# Patient Record
Sex: Female | Born: 1990 | Race: Black or African American | Hispanic: No | Marital: Single | Smoking: Never smoker
Health system: Southern US, Community
[De-identification: ages and names within clinical notes are randomized; demographics above are authoritative.]

## PROBLEM LIST (undated history)

## (undated) DIAGNOSIS — E669 Obesity, unspecified: Secondary | ICD-10-CM

## (undated) DIAGNOSIS — I10 Essential (primary) hypertension: Secondary | ICD-10-CM

## (undated) HISTORY — PX: APPENDECTOMY: SHX54

## (undated) HISTORY — DX: Essential (primary) hypertension: I10

## (undated) HISTORY — PX: ABDOMINAL SURGERY: SHX537

## (undated) HISTORY — PX: GYNECOLOGIC CRYOSURGERY: SHX857

## (undated) HISTORY — PX: INDUCED ABORTION: SHX677

---

## 2012-09-11 ENCOUNTER — Encounter (HOSPITAL_BASED_OUTPATIENT_CLINIC_OR_DEPARTMENT_OTHER): Payer: Self-pay

## 2012-09-11 ENCOUNTER — Emergency Department (HOSPITAL_BASED_OUTPATIENT_CLINIC_OR_DEPARTMENT_OTHER)
Admission: EM | Admit: 2012-09-11 | Discharge: 2012-09-11 | Disposition: A | Discharge status: Home / Self Care | Payer: Medicaid - Out of State | Attending: Emergency Medicine | Admitting: Emergency Medicine

## 2012-09-11 DIAGNOSIS — R509 Fever, unspecified: Secondary | ICD-10-CM | POA: Insufficient documentation

## 2012-09-11 DIAGNOSIS — IMO0001 Reserved for inherently not codable concepts without codable children: Secondary | ICD-10-CM | POA: Insufficient documentation

## 2012-09-11 MED ORDER — ACETAMINOPHEN 325 MG PO TABS
650.0000 mg | ORAL_TABLET | Freq: Once | ORAL | Status: AC
Start: 2012-09-11 — End: 2012-09-11
  Administered 2012-09-11: 650 mg via ORAL
  Filled 2012-09-11: qty 2

## 2012-09-11 NOTE — ED Notes (Signed)
Patient reports that she wants to be checked for the flu. Reports general bodyaches and feels tired since Saturday am. No cold symptoms, denies fever.

## 2012-09-11 NOTE — ED Provider Notes (Signed)
History     CSN: [id number]  Arrival date & time 09/11/12  0307   First MD Initiated Contact with Patient 09/11/12 [phone]      Chief Complaint  Patient presents with  . bodyaches     (Consider location/radiation/quality/duration/timing/severity/associated sxs/prior treatment) HPI Pt presents with c/o fever and body aches.  She states symptoms began yesterday morning.  She took ibuprofen for her symptoms initially.  This did provide some temporary relief.  No cough, no sore throat, no vomiting or diarrhea.  She has been drinking liquids today.  She states her primary reason for coming to the ED was to be tested for flu.  There are no other associated systemic symptoms, there are no other alleviating or modifying factors.   History reviewed. No pertinent past medical history.  History reviewed. No pertinent past surgical history.  No family history on file.  History  Substance Use Topics  . Smoking status: Never Smoker   . Smokeless tobacco: Not on file  . Alcohol Use: Yes     Comment: social    OB History    Grav Para Term Preterm Abortions TAB SAB Ect Mult Living                  Review of Systems ROS reviewed and all otherwise negative except for mentioned in HPI  Allergies  Review of patient's allergies indicates no known allergies.  Home Medications  No current outpatient prescriptions on file.  BP 160/99  Pulse 88  Temp 99.5 F (37.5 C) (Oral)  Resp 16  SpO2 100%  LMP 08/09/2012 Vitals reviewed Physical Exam Physical Examination: General appearance - alert, well appearing, and in no distress Mental status - alert, oriented to person, place, and time Eyes - no scleral icterus, no conjunctival injection Mouth - mucous membranes moist, pharynx normal without lesions Chest - clear to auscultation, no wheezes, rales or rhonchi, symmetric air entry Heart - normal rate, regular rhythm, normal S1, S2, no murmurs, rubs, clicks or gallops Abdomen - soft,  nontender, nondistended, no masses or organomegaly Extremities - peripheral pulses normal, no pedal edema, no clubbing or cyanosis Skin - normal coloration and turgor, no rashes  ED Course  Procedures (including critical care time)  Labs Reviewed - No data to display No results found.   1. Febrile illness       MDM  Pt presenting with c/o fever, myalgias.  Likely viral syndrome, possibly flu.  Discussed with patient symptomatic treatment and signs that warrant re-eval.  Discharged with strict return precautions.  Pt agreeable with plan.        Ethelda Chick, MD 09/11/12 [phone]

## 2017-06-02 ENCOUNTER — Encounter: Payer: Medicaid - Out of State | Admitting: Family Medicine

## 2017-06-22 ENCOUNTER — Other Ambulatory Visit (HOSPITAL_COMMUNITY)
Admission: RE | Admit: 2017-06-22 | Discharge: 2017-06-22 | Disposition: A | Discharge status: Home / Self Care | Payer: Medicaid Other | Source: Ambulatory Visit | Attending: Advanced Practice Midwife | Admitting: Advanced Practice Midwife

## 2017-06-22 ENCOUNTER — Encounter: Payer: Self-pay | Admitting: Advanced Practice Midwife

## 2017-06-22 ENCOUNTER — Ambulatory Visit (INDEPENDENT_AMBULATORY_CARE_PROVIDER_SITE_OTHER): Payer: Medicaid Other | Admitting: Advanced Practice Midwife

## 2017-06-22 VITALS — BP 157/105 | HR 79 | Ht 67.0 in | Wt 273.0 lb

## 2017-06-22 DIAGNOSIS — Z01419 Encounter for gynecological examination (general) (routine) without abnormal findings: Secondary | ICD-10-CM | POA: Insufficient documentation

## 2017-06-22 DIAGNOSIS — Z975 Presence of (intrauterine) contraceptive device: Secondary | ICD-10-CM | POA: Insufficient documentation

## 2017-06-22 DIAGNOSIS — I10 Essential (primary) hypertension: Secondary | ICD-10-CM | POA: Diagnosis not present

## 2017-06-22 DIAGNOSIS — Z0000 Encounter for general adult medical examination without abnormal findings: Secondary | ICD-10-CM | POA: Diagnosis not present

## 2017-06-22 DIAGNOSIS — Z9889 Other specified postprocedural states: Secondary | ICD-10-CM

## 2017-06-22 NOTE — Progress Notes (Signed)
Patient states she takes a water pill but can remember the name.  Patient states she has an IUD but doesn't remember which one it is and how long she has had it. After speaking with her discuss it was after her delivery in 2015. Kathrene Alu RNBSN

## 2017-06-22 NOTE — Progress Notes (Signed)
Patient ID: Bailey Thomas, female   DOB: [birth year], 26 y.o.   MRN: [id number] GYNECOLOGY ANNUAL PREVENTATIVE CARE ENCOUNTER NOTE  Subjective:   Kaijah Abts is a 26 y.o. G75P0020 female here for a routine annual gynecologic exam.  Current complaints: Heavy and lengthy periods for many years, has IUD (unsure what type).   Denies abnormal vaginal bleeding, discharge, pelvic pain, problems with intercourse or other gynecologic concerns.    History C/S.  Has IUD.  Does want STD testing though not sexually active for past several months.  Gynecologic History Patient's last menstrual period was 05/31/2017. Contraception: IUD Last Pap: unsure. Results were: normal   But has had Cryo at age 20 for abnormal pap.  States pap normal ever since.  Obstetric History OB History  Gravida Para Term Preterm AB Living  3       2    SAB TAB Ectopic Multiple Live Births    2     1    # Outcome Date GA Lbr Len/2nd Weight Sex Delivery Anes PTL Lv  3 Gravida 2015     [id number]-[id number]        Complications: Breech delivery, other fetus  2 TAB           1 TAB               Past Medical History:  Diagnosis Date  . Hypertension     Past Surgical History:  Procedure Laterality Date  . APPENDECTOMY    . CESAREAN SECTION     Is on 2 meds for hypertension, unsure what they are  No current outpatient prescriptions on file prior to visit.   No current facility-administered medications on file prior to visit.     No Known Allergies  Social History   Social History  . Marital status: Single    Spouse name: N/A  . Number of children: N/A  . Years of education: N/A   Occupational History  . Not on file.   Social History Main Topics  . Smoking status: Never Smoker  . Smokeless tobacco: Never Used  . Alcohol use Yes     Comment: social  . Drug use: No  . Sexual activity: Not Currently    Birth control/ protection: IUD   Other Topics Concern  . Not on file   Social History Narrative   Moved here  from [place]   Works as Nutritional therapist    Family History  Problem Relation Age of Onset  . Hypertension Mother   . Diabetes Mother   . Cancer Neg Hx   . Stroke Neg Hx     The following portions of the patient's history were reviewed and updated as appropriate: allergies, current medications, past family history, past medical history, past social history, past surgical history and problem list.  Review of Systems Constitutional: negative Respiratory: negative Cardiovascular: negative Gastrointestinal: negative, Normal bowel movements Genitourinary:negative, history of frequent BV Integument/breast: negative Hematologic/lymphatic: negative Musculoskeletal:negative Behavioral/Psych: negative Endocrine: negative   Objective:  BP (!) 157/105 (BP Location: Left Arm)   Pulse 79   Ht 5\' 7"  (1.702 m)   Wt 273 lb (123.8 kg)   LMP 05/31/2017   BMI 42.76 kg/m  CONSTITUTIONAL: Well-developed, well-nourished female in no acute distress.  HENT:  Normocephalic, atraumatic NECK: Normal range of motion, supple, no masses.  Normal thyroid.  SKIN: Skin is warm and dry. No rash noted. Not diaphoretic. No erythema. No pallor. NEUROLOGIC: Alert and oriented to person, place, and time. No  cranial nerve deficit noted. PSYCHIATRIC: Normal mood and affect. Normal behavior. Normal judgment and thought content. CARDIOVASCULAR: Normal heart rate noted, regular rhythm RESPIRATORY: Clear to auscultation bilaterally. Effort and breath sounds normal, no problems with respiration noted. BREASTS: Symmetric in size. No masses, skin changes, nipple drainage, or lymphadenopathy. ABDOMEN: Soft, normal bowel sounds, no distention noted.  No tenderness, rebound or guarding.  PELVIC: Normal appearing external genitalia; normal appearing vaginal mucosa and cervix.  No abnormal discharge noted.  Pap smear obtained.  Normal uterine size, no other palpable masses, no uterine or adnexal tenderness. MUSCULOSKELETAL:  Normal range of motion. No tenderness.  No cyanosis, clubbing, or edema.     Assessment:  Annual gynecologic examination with pap smear History of cryosurgery Hypertension   Plan:  Will follow up results of pap smear and manage accordingly. Discussed need to establish withPrimary Care doctor. Assisted her in finding providers in [facility] who take Medicaid ([facility]) We added TSH, CBC, and Lipid profile since she is fasting.  This will hopefully assist her primary MD when she goes to her physical Discussed uncontrolled hypertension and need for care ASAP Added STD testing to pap per request Advised about signing up for My Chart Routine preventative health maintenance measures emphasized. Please refer to After Visit Summary for other counseling recommendations.

## 2017-06-22 NOTE — Patient Instructions (Signed)
Hypertension Hypertension, commonly called high blood pressure, is when the force of blood pumping through the arteries is too strong. The arteries are the blood vessels that carry blood from the heart throughout the body. Hypertension forces the heart to work harder to pump blood and may cause arteries to become narrow or stiff. Having untreated or uncontrolled hypertension can cause heart attacks, strokes, kidney disease, and other problems. A blood pressure reading consists of a higher number over a lower number. Ideally, your blood pressure should be below 120/80. The first ("top") number is called the systolic pressure. It is a measure of the pressure in your arteries as your heart beats. The second ("bottom") number is called the diastolic pressure. It is a measure of the pressure in your arteries as the heart relaxes. What are the causes? The cause of this condition is not known. What increases the risk? Some risk factors for high blood pressure are under your control. Others are not. Factors you can change  Smoking.  Having type 2 diabetes mellitus, high cholesterol, or both.  Not getting enough exercise or physical activity.  Being overweight.  Having too much fat, sugar, calories, or salt (sodium) in your diet.  Drinking too much alcohol. Factors that are difficult or impossible to change  Having chronic kidney disease.  Having a family history of high blood pressure.  Age. Risk increases with age.  Race. You may be at higher risk if you are African-American.  Gender. Men are at higher risk than women before age 45. After age 65, women are at higher risk than men.  Having obstructive sleep apnea.  Stress. What are the signs or symptoms? Extremely high blood pressure (hypertensive crisis) may cause:  Headache.  Anxiety.  Shortness of breath.  Nosebleed.  Nausea and vomiting.  Severe chest pain.  Jerky movements you cannot control (seizures).  How is this  diagnosed? This condition is diagnosed by measuring your blood pressure while you are seated, with your arm resting on a surface. The cuff of the blood pressure monitor will be placed directly against the skin of your upper arm at the level of your heart. It should be measured at least twice using the same arm. Certain conditions can cause a difference in blood pressure between your right and left arms. Certain factors can cause blood pressure readings to be lower or higher than normal (elevated) for a short period of time:  When your blood pressure is higher when you are in a health care provider's office than when you are at home, this is called white coat hypertension. Most people with this condition do not need medicines.  When your blood pressure is higher at home than when you are in a health care provider's office, this is called masked hypertension. Most people with this condition may need medicines to control blood pressure.  If you have a high blood pressure reading during one visit or you have normal blood pressure with other risk factors:  You may be asked to return on a different day to have your blood pressure checked again.  You may be asked to monitor your blood pressure at home for 1 week or longer.  If you are diagnosed with hypertension, you may have other blood or imaging tests to help your health care provider understand your overall risk for other conditions. How is this treated? This condition is treated by making healthy lifestyle changes, such as eating healthy foods, exercising more, and reducing your alcohol intake. Your   health care provider may prescribe medicine if lifestyle changes are not enough to get your blood pressure under control, and if:  Your systolic blood pressure is above 130.  Your diastolic blood pressure is above 80.  Your personal target blood pressure may vary depending on your medical conditions, your age, and other factors. Follow these  instructions at home: Eating and drinking  Eat a diet that is high in fiber and potassium, and low in sodium, added sugar, and fat. An example eating plan is called the DASH (Dietary Approaches to Stop Hypertension) diet. To eat this way: ? Eat plenty of fresh fruits and vegetables. Try to fill half of your plate at each meal with fruits and vegetables. ? Eat whole grains, such as whole wheat pasta, brown rice, or whole grain bread. Fill about one quarter of your plate with whole grains. ? Eat or drink low-fat dairy products, such as skim milk or low-fat yogurt. ? Avoid fatty cuts of meat, processed or cured meats, and poultry with skin. Fill about one quarter of your plate with lean proteins, such as fish, chicken without skin, beans, eggs, and tofu. ? Avoid premade and processed foods. These tend to be higher in sodium, added sugar, and fat.  Reduce your daily sodium intake. Most people with hypertension should eat less than 1,500 mg of sodium a day.  Limit alcohol intake to no more than 1 drink a day for nonpregnant women and 2 drinks a day for men. One drink equals 12 oz of beer, 5 oz of wine, or 1 oz of hard liquor. Lifestyle  Work with your health care provider to maintain a healthy body weight or to lose weight. Ask what an ideal weight is for you.  Get at least 30 minutes of exercise that causes your heart to beat faster (aerobic exercise) most days of the week. Activities may include walking, swimming, or biking.  Include exercise to strengthen your muscles (resistance exercise), such as pilates or lifting weights, as part of your weekly exercise routine. Try to do these types of exercises for 30 minutes at least 3 days a week.  Do not use any products that contain nicotine or tobacco, such as cigarettes and e-cigarettes. If you need help quitting, ask your health care provider.  Monitor your blood pressure at home as told by your health care provider.  Keep all follow-up visits as  told by your health care provider. This is important. Medicines  Take over-the-counter and prescription medicines only as told by your health care provider. Follow directions carefully. Blood pressure medicines must be taken as prescribed.  Do not skip doses of blood pressure medicine. Doing this puts you at risk for problems and can make the medicine less effective.  Ask your health care provider about side effects or reactions to medicines that you should watch for. Contact a health care provider if:  You think you are having a reaction to a medicine you are taking.  You have headaches that keep coming back (recurring).  You feel dizzy.  You have swelling in your ankles.  You have trouble with your vision. Get help right away if:  You develop a severe headache or confusion.  You have unusual weakness or numbness.  You feel faint.  You have severe pain in your chest or abdomen.  You vomit repeatedly.  You have trouble breathing. Summary  Hypertension is when the force of blood pumping through your arteries is too strong. If this condition is not   in your ankles.   You have trouble with your vision.  Get help right away if:   You develop a severe headache or confusion.   You have unusual weakness or numbness.   You feel faint.   You have severe pain in your chest or abdomen.   You vomit repeatedly.   You have trouble breathing.  Summary   Hypertension is when the force of blood pumping through your arteries is too strong. If this condition is not controlled, it may put you at risk for serious complications.   Your personal target blood pressure may vary depending on your medical conditions, your age, and other factors. For most people, a normal blood pressure is less than 120/80.   Hypertension is treated with lifestyle changes, medicines, or a combination of both. Lifestyle changes include weight loss, eating a healthy, low-sodium diet, exercising more, and limiting alcohol.  This information is not intended to replace advice given to you by your health care provider. Make sure you discuss any questions you have with your health care provider.  Document Released: 08/10/2005 Document Revised: 07/08/2016 Document Reviewed: 07/08/2016  [employer] Interactive Patient Education  2018 [employer]  Health Maintenance, Female  Adopting a healthy lifestyle and getting preventive care can  go a long way to promote health and wellness. Talk with your health care provider about what schedule of regular examinations is right for you. This is a good chance for you to check in with your provider about disease prevention and staying healthy.  In between checkups, there are plenty of things you can do on your own. Experts have done a lot of research about which lifestyle changes and preventive measures are most likely to keep you healthy. Ask your health care provider for more information.  Weight and diet  Eat a healthy diet   Be sure to include plenty of vegetables, fruits, low-fat dairy products, and lean protein.   Do not eat a lot of foods high in solid fats, added sugars, or salt.   Get regular exercise. This is one of the most important things you can do for your health.  ? Most adults should exercise for at least 150 minutes each week. The exercise should increase your heart rate and make you sweat (moderate-intensity exercise).  ? Most adults should also do strengthening exercises at least twice a week. This is in addition to the moderate-intensity exercise.    Maintain a healthy weight   Body mass index (BMI) is a measurement that can be used to identify possible weight problems. It estimates body fat based on height and weight. Your health care provider can help determine your BMI and help you achieve or maintain a healthy weight.   For females 20 years of age and older:  ? A BMI below 18.5 is considered underweight.  ? A BMI of 18.5 to 24.9 is normal.  ? A BMI of 25 to 29.9 is considered overweight.  ? A BMI of 30 and above is considered obese.    Watch levels of cholesterol and blood lipids   You should start having your blood tested for lipids and cholesterol at 27 years of age, then have this test every 5 years.   You may need to have your cholesterol levels checked more often if:  ? Your lipid or cholesterol levels are high.  ? You are older than 27 years of age.  ? You are at high  risk for heart disease.    Cancer   screening  Lung Cancer   Lung cancer screening is recommended for adults 55-80 years old who are at high risk for lung cancer because of a history of smoking.   A yearly low-dose CT scan of the lungs is recommended for people who:  ? Currently smoke.  ? Have quit within the past 15 years.  ? Have at least a 30-pack-year history of smoking. A pack year is smoking an average of one pack of cigarettes a day for 1 year.   Yearly screening should continue until it has been 15 years since you quit.   Yearly screening should stop if you develop a health problem that would prevent you from having lung cancer treatment.    Breast Cancer   Practice breast self-awareness. This means understanding how your breasts normally appear and feel.   It also means doing regular breast self-exams. Let your health care provider know about any changes, no matter how small.   If you are in your 20s or 30s, you should have a clinical breast exam (CBE) by a health care provider every 1-3 years as part of a regular health exam.   If you are 40 or older, have a CBE every year. Also consider having a breast X-ray (mammogram) every year.   If you have a family history of breast cancer, talk to your health care provider about genetic screening.   If you are at high risk for breast cancer, talk to your health care provider about having an MRI and a mammogram every year.   Breast cancer gene (BRCA) assessment is recommended for women who have family members with BRCA-related cancers. BRCA-related cancers include:  ? Breast.  ? Ovarian.  ? Tubal.  ? Peritoneal cancers.   Results of the assessment will determine the need for genetic counseling and BRCA1 and BRCA2 testing.    Cervical Cancer  Your health care provider may recommend that you be screened regularly for cancer of the pelvic organs (ovaries, uterus, and vagina). This screening involves a pelvic examination, including checking for microscopic  changes to the surface of your cervix (Pap test). You may be encouraged to have this screening done every 3 years, beginning at age 21.   For women ages 30-65, health care providers may recommend pelvic exams and Pap testing every 3 years, or they may recommend the Pap and pelvic exam, combined with testing for human papilloma virus (HPV), every 5 years. Some types of HPV increase your risk of cervical cancer. Testing for HPV may also be done on women of any age with unclear Pap test results.   Other health care providers may not recommend any screening for nonpregnant women who are considered low risk for pelvic cancer and who do not have symptoms. Ask your health care provider if a screening pelvic exam is right for you.   If you have had past treatment for cervical cancer or a condition that could lead to cancer, you need Pap tests and screening for cancer for at least 20 years after your treatment. If Pap tests have been discontinued, your risk factors (such as having a new sexual partner) need to be reassessed to determine if screening should resume. Some women have medical problems that increase the chance of getting cervical cancer. In these cases, your health care provider may recommend more frequent screening and Pap tests.    Colorectal Cancer   This type of cancer can be detected and often prevented.   Routine colorectal cancer screening usually   begins at 27 years of age and continues through 27 years of age.   Your health care provider may recommend screening at an earlier age if you have risk factors for colon cancer.   Your health care provider may also recommend using home test kits to check for hidden blood in the stool.   A small camera at the end of a tube can be used to examine your colon directly (sigmoidoscopy or colonoscopy). This is done to check for the earliest forms of colorectal cancer.   Routine screening usually begins at age 50.   Direct examination of the colon should be  repeated every 5-10 years through 27 years of age. However, you may need to be screened more often if early forms of precancerous polyps or small growths are found.    Skin Cancer   Check your skin from head to toe regularly.   Tell your health care provider about any new moles or changes in moles, especially if there is a change in a mole's shape or color.   Also tell your health care provider if you have a mole that is larger than the size of a pencil eraser.   Always use sunscreen. Apply sunscreen liberally and repeatedly throughout the day.   Protect yourself by wearing long sleeves, pants, a wide-brimmed hat, and sunglasses whenever you are outside.    Heart disease, diabetes, and high blood pressure   High blood pressure causes heart disease and increases the risk of stroke. High blood pressure is more likely to develop in:  ? People who have blood pressure in the high end of the normal range (130-139/85-89 mm Hg).  ? People who are overweight or obese.  ? People who are African American.   If you are 18-39 years of age, have your blood pressure checked every 3-5 years. If you are 40 years of age or older, have your blood pressure checked every year. You should have your blood pressure measured twice--once when you are at a hospital or clinic, and once when you are not at a hospital or clinic. Record the average of the two measurements. To check your blood pressure when you are not at a hospital or clinic, you can use:  ? An automated blood pressure machine at a pharmacy.  ? A home blood pressure monitor.   If you are between 55 years and 79 years old, ask your health care provider if you should take aspirin to prevent strokes.   Have regular diabetes screenings. This involves taking a blood sample to check your fasting blood sugar level.  ? If you are at a normal weight and have a low risk for diabetes, have this test once every three years after 27 years of age.  ? If you are overweight and have a  high risk for diabetes, consider being tested at a younger age or more often.  Preventing infection  Hepatitis B   If you have a higher risk for hepatitis B, you should be screened for this virus. You are considered at high risk for hepatitis B if:  ? You were born in a country where hepatitis B is common. Ask your health care provider which countries are considered high risk.  ? Your parents were born in a high-risk country, and you have not been immunized against hepatitis B (hepatitis B vaccine).  ? You have HIV or AIDS.  ? You use needles to inject street drugs.  ? You live with someone who has   hepatitis B.  ? You have had sex with someone who has hepatitis B.  ? You get hemodialysis treatment.  ? You take certain medicines for conditions, including cancer, organ transplantation, and autoimmune conditions.    Hepatitis C   Blood testing is recommended for:  ? Everyone born from 1945 through 1965.  ? Anyone with known risk factors for hepatitis C.    Sexually transmitted infections (STIs)   You should be screened for sexually transmitted infections (STIs) including gonorrhea and chlamydia if:  ? You are sexually active and are younger than 27 years of age.  ? You are older than 27 years of age and your health care provider tells you that you are at risk for this type of infection.  ? Your sexual activity has changed since you were last screened and you are at an increased risk for chlamydia or gonorrhea. Ask your health care provider if you are at risk.   If you do not have HIV, but are at risk, it may be recommended that you take a prescription medicine daily to prevent HIV infection. This is called pre-exposure prophylaxis (PrEP). You are considered at risk if:  ? You are sexually active and do not regularly use condoms or know the HIV status of your partner(s).  ? You take drugs by injection.  ? You are sexually active with a partner who has HIV.    Talk with your health care provider about whether you are  at high risk of being infected with HIV. If you choose to begin PrEP, you should first be tested for HIV. You should then be tested every 3 months for as long as you are taking PrEP.  Pregnancy   If you are premenopausal and you may become pregnant, ask your health care provider about preconception counseling.   If you may become pregnant, take 400 to 800 micrograms (mcg) of folic acid every day.   If you want to prevent pregnancy, talk to your health care provider about birth control (contraception).  Osteoporosis and menopause   Osteoporosis is a disease in which the bones lose minerals and strength with aging. This can result in serious bone fractures. Your risk for osteoporosis can be identified using a bone density scan.   If you are 65 years of age or older, or if you are at risk for osteoporosis and fractures, ask your health care provider if you should be screened.   Ask your health care provider whether you should take a calcium or vitamin D supplement to lower your risk for osteoporosis.   Menopause may have certain physical symptoms and risks.   Hormone replacement therapy may reduce some of these symptoms and risks.  Talk to your health care provider about whether hormone replacement therapy is right for you.  Follow these instructions at home:   Schedule regular health, dental, and eye exams.   Stay current with your immunizations.   Do not use any tobacco products including cigarettes, chewing tobacco, or electronic cigarettes.   If you are pregnant, do not drink alcohol.   If you are breastfeeding, limit how much and how often you drink alcohol.   Limit alcohol intake to no more than 1 drink per day for nonpregnant women. One drink equals 12 ounces of beer, 5 ounces of wine, or 1 ounces of hard liquor.   Do not use street drugs.   Do not share needles.   Ask your health care provider for help if you need

## 2017-06-23 LAB — LIPID PANEL
Chol/HDL Ratio: 4.4 ratio (ref 0.0–4.4)
Cholesterol, Total: 226 mg/dL — ABNORMAL HIGH (ref 100–199)
HDL: 51 mg/dL (ref >39)
LDL Calculated: 159 mg/dL — ABNORMAL HIGH (ref 0–99)
TRIGLYCERIDES: 81 mg/dL (ref 0–149)
VLDL CHOLESTEROL CAL: 16 mg/dL (ref 5–40)

## 2017-06-23 LAB — CBC
Hematocrit: 34.2 % (ref 34.0–46.6)
Hemoglobin: 11 g/dL — ABNORMAL LOW (ref 11.1–15.9)
MCH: 25.8 pg — ABNORMAL LOW (ref 26.6–33.0)
MCHC: 32.2 g/dL (ref 31.5–35.7)
MCV: 80 fL (ref 79–97)
PLATELETS: 192 10*3/uL (ref 150–379)
RBC: 4.26 x10E6/uL (ref 3.77–5.28)
RDW: 16.2 % — ABNORMAL HIGH (ref 12.3–15.4)
WBC: 3.5 10*3/uL (ref 3.4–10.8)

## 2017-06-23 LAB — TSH: TSH: 1.3 u[IU]/mL (ref 0.450–4.500)

## 2017-06-23 LAB — HIV ANTIBODY (ROUTINE TESTING W REFLEX): HIV Screen 4th Generation wRfx: NONREACTIVE

## 2017-06-23 LAB — RPR: RPR Ser Ql: NONREACTIVE

## 2017-06-24 ENCOUNTER — Other Ambulatory Visit: Payer: Self-pay | Admitting: Advanced Practice Midwife

## 2017-06-24 LAB — CYTOLOGY - PAP
BACTERIAL VAGINITIS: POSITIVE — AB
Candida vaginitis: NEGATIVE
Chlamydia: NEGATIVE
DIAGNOSIS: NEGATIVE
Neisseria Gonorrhea: NEGATIVE

## 2017-06-24 NOTE — Progress Notes (Signed)
+   [place] No pharmacy listed WIll have RN call pt to get pharm Seabron Spates, CNM

## 2017-09-04 ENCOUNTER — Other Ambulatory Visit: Payer: Self-pay

## 2017-09-04 ENCOUNTER — Encounter (HOSPITAL_BASED_OUTPATIENT_CLINIC_OR_DEPARTMENT_OTHER): Payer: Self-pay | Admitting: Emergency Medicine

## 2017-09-04 ENCOUNTER — Emergency Department (HOSPITAL_BASED_OUTPATIENT_CLINIC_OR_DEPARTMENT_OTHER)
Admission: EM | Admit: 2017-09-04 | Discharge: 2017-09-04 | Disposition: A | Discharge status: Home / Self Care | Payer: Medicaid Other | Attending: Emergency Medicine | Admitting: Emergency Medicine

## 2017-09-04 DIAGNOSIS — Z30011 Encounter for initial prescription of contraceptive pills: Secondary | ICD-10-CM | POA: Diagnosis not present

## 2017-09-04 DIAGNOSIS — N898 Other specified noninflammatory disorders of vagina: Secondary | ICD-10-CM | POA: Insufficient documentation

## 2017-09-04 DIAGNOSIS — A599 Trichomoniasis, unspecified: Secondary | ICD-10-CM | POA: Diagnosis not present

## 2017-09-04 DIAGNOSIS — I10 Essential (primary) hypertension: Secondary | ICD-10-CM | POA: Diagnosis not present

## 2017-09-04 DIAGNOSIS — N390 Urinary tract infection, site not specified: Secondary | ICD-10-CM | POA: Diagnosis not present

## 2017-09-04 DIAGNOSIS — R300 Dysuria: Secondary | ICD-10-CM | POA: Diagnosis present

## 2017-09-04 DIAGNOSIS — Z30432 Encounter for removal of intrauterine contraceptive device: Secondary | ICD-10-CM

## 2017-09-04 LAB — PREGNANCY, URINE: Preg Test, Ur: NEGATIVE

## 2017-09-04 LAB — URINALYSIS, MICROSCOPIC (REFLEX)

## 2017-09-04 LAB — WET PREP, GENITAL
Clue Cells Wet Prep HPF POC: NONE SEEN
Sperm: NONE SEEN
WBC WET PREP: NONE SEEN
YEAST WET PREP: NONE SEEN

## 2017-09-04 LAB — URINALYSIS, ROUTINE W REFLEX MICROSCOPIC
BILIRUBIN URINE: NEGATIVE
Glucose, UA: NEGATIVE mg/dL
HGB URINE DIPSTICK: NEGATIVE
Ketones, ur: NEGATIVE mg/dL
NITRITE: NEGATIVE
PH: 6.5 (ref 5.0–8.0)
Protein, ur: NEGATIVE mg/dL
SPECIFIC GRAVITY, URINE: 1.015 (ref 1.005–1.030)

## 2017-09-04 MED ORDER — CEFTRIAXONE SODIUM 250 MG IJ SOLR
250.0000 mg | Freq: Once | INTRAMUSCULAR | Status: AC
  Administered 2017-09-04: 250 mg via INTRAMUSCULAR
  Filled 2017-09-04: qty 250

## 2017-09-04 MED ORDER — NORGESTIMATE-ETH ESTRADIOL 0.25-35 MG-MCG PO TABS: 1.0000 | ORAL_TABLET | Freq: Every day | ORAL | 11 refills | Status: AC

## 2017-09-04 MED ORDER — DOXYCYCLINE HYCLATE 100 MG PO TABS
100.0000 mg | ORAL_TABLET | Freq: Once | ORAL | Status: AC
  Administered 2017-09-04: 100 mg via ORAL
  Filled 2017-09-04: qty 1

## 2017-09-04 MED ORDER — METRONIDAZOLE 500 MG PO TABS
2000.0000 mg | ORAL_TABLET | Freq: Once | ORAL | Status: AC
  Administered 2017-09-04: 2000 mg via ORAL
  Filled 2017-09-04: qty 4

## 2017-09-04 MED ORDER — DOXYCYCLINE HYCLATE 100 MG PO TBEC: 100.0000 mg | DELAYED_RELEASE_TABLET | Freq: Two times a day (BID) | ORAL | 0 refills | Status: DC

## 2017-09-04 NOTE — Discharge Instructions (Addendum)
You were seen and evaluated in the Emergency Department for urinary symptoms. Please take the full course of your antibiotics.   You were treated for chlamydia and trichomoniasis here in the Emergency Department.  Your IUD was removed and contraceptive pills were prescribed.  Follow up with your regular doctor.

## 2017-09-04 NOTE — ED Provider Notes (Signed)
[facility] EMERGENCY DEPARTMENT Provider Note   CSN: [id number] Arrival date & time: 09/04/17  1334     History   Chief Complaint Chief Complaint  Patient presents with  . Vaginal Itching    HPI Bailey Thomas is a 27 y.o. female. Vaginal itching started Tuesday. Vaginal area now irritated, less pruritic since using vagisil. No discharge. No sores or lesions. Sexually active and uses IUD for birth control. Has not had yeast infection for 10 years. No abnormal vaginal bleeding, no significant pelvic pain or fever. Patient denies known exposure to STD.  Patient does endorse urinary frequency and dysuria. She denies urgency. No fevers, no back pain.  Patient's last menstrual period was 09/02/2017.  Past Medical History:  Diagnosis Date  . Hypertension     Patient Active Problem List   Diagnosis Date Noted  . History of cryosurgery 06/22/2017    Past Surgical History:  Procedure Laterality Date  . APPENDECTOMY    . CESAREAN SECTION      OB History    Gravida Para Term Preterm AB Living   3       2     SAB TAB Ectopic Multiple Live Births     2     1       Home Medications    Prior to Admission medications   Medication Sig Start Date End Date Taking? Authorizing Provider  norgestimate-ethinyl estradiol (ORTHO-CYCLEN,SPRINTEC,PREVIFEM) 0.25-35 MG-MCG tablet Take 1 tablet by mouth daily. 09/04/17   Everrett Coombe, MD    Family History Family History  Problem Relation Age of Onset  . Hypertension Mother   . Diabetes Mother   . Cancer Neg Hx   . Stroke Neg Hx     Social History Social History   Tobacco Use  . Smoking status: Never Smoker  . Smokeless tobacco: Never Used  Substance Use Topics  . Alcohol use: Yes    Comment: social  . Drug use: No     Allergies   Patient has no known allergies.   Review of Systems Review of Systems See HPI for ROS   Physical Exam Updated Vital Signs BP (!) 148/114 (BP Location: Right Arm)   Pulse 94    Temp 98.4 F (36.9 C) (Oral)   Resp 18   Ht 5\' 7"  (1.702 m)   Wt 124.7 kg (275 lb)   LMP 09/02/2017   SpO2 100%   BMI 43.07 kg/m   Physical Exam  Constitutional: She appears well-developed and well-nourished. No distress.  Neck: Neck supple.  Cardiovascular: Normal rate and regular rhythm.  Pulmonary/Chest: Effort normal and breath sounds normal.  Skin: Skin is warm and dry. She is not diaphoretic.  Female genitalia: Vagina: normal appearing vagina with normal color and discharge, no lesions Cervix: cervical discharge present - white IUD string clearly visualized    ED Treatments / Results  Labs (all labs ordered are listed, but only abnormal results are displayed) Labs Reviewed  URINALYSIS, ROUTINE W REFLEX MICROSCOPIC - Abnormal; Notable for the following components:      Result Value   Leukocytes, UA SMALL (*)    All other components within normal limits  URINALYSIS, MICROSCOPIC (REFLEX) - Abnormal; Notable for the following components:   Bacteria, UA MANY (*)    Squamous Epithelial / LPF 0-5 (*)    All other components within normal limits  WET PREP, GENITAL  PREGNANCY, URINE  GC/CHLAMYDIA PROBE AMP ([place]) NOT AT [facility]    EKG  EKG Interpretation None       Radiology No results found.  Procedures Procedures (including critical care time)  IUD Removal Speculum placed. IUD string clearly visualized and pulled with gentle tension until IUD was removed. No bleeding. Patient with mild cramping.  Medications Ordered in ED Medications  cefTRIAXone (ROCEPHIN) injection 250 mg (250 mg Intramuscular Given 09/04/17 1516)  doxycycline (VIBRA-TABS) tablet 100 mg (100 mg Oral Given 09/04/17 1516)     Initial Impression / Assessment and Plan / ED Course  I have reviewed the triage vital signs and the nursing notes.  Pertinent labs & imaging results that were available during my care of the patient were reviewed by me and considered in my medical decision making  (see chart for details).   UTI Urine studies and symptoms consistent with UTI. Patient sent with doxycycline which will also cover for chlamydia.  Trich Wet prep notable for trichomoniasis. Patient was given one time dose at 2g to treat in ED. Patient was also given ceftriaxone injection to cover for chlamydia in the ED.  OCP Patient asked for IUD to be removed. It was removed in the emergency department and OCP was prescribed at patient request. OCP counseling provided.  Patient considered stable for discharge with PCP.   Final Clinical Impressions(s) / ED Diagnoses   Final diagnoses:  None    ED Discharge Orders        Ordered    norgestimate-ethinyl estradiol (ORTHO-CYCLEN,SPRINTEC,PREVIFEM) 0.25-35 MG-MCG tablet  Daily     09/04/17 1529       Everrett Coombe, MD 09/04/17 1539    Drenda Freeze, MD 09/05/17 [phone]

## 2017-09-04 NOTE — ED Triage Notes (Signed)
Patient states that she is having pain with urination and it is "itchy" - she has had similar symptoms when her IUD was falling out last time

## 2017-09-06 LAB — GC/CHLAMYDIA PROBE AMP (~~LOC~~) NOT AT ARMC
CHLAMYDIA, DNA PROBE: NEGATIVE
NEISSERIA GONORRHEA: NEGATIVE

## 2017-10-12 ENCOUNTER — Emergency Department (HOSPITAL_BASED_OUTPATIENT_CLINIC_OR_DEPARTMENT_OTHER)
Admission: EM | Admit: 2017-10-12 | Discharge: 2017-10-12 | Disposition: A | Discharge status: Home / Self Care | Payer: Medicaid Other | Attending: Emergency Medicine | Admitting: Emergency Medicine

## 2017-10-12 ENCOUNTER — Encounter (HOSPITAL_BASED_OUTPATIENT_CLINIC_OR_DEPARTMENT_OTHER): Payer: Self-pay | Admitting: Emergency Medicine

## 2017-10-12 ENCOUNTER — Other Ambulatory Visit: Payer: Self-pay

## 2017-10-12 DIAGNOSIS — N898 Other specified noninflammatory disorders of vagina: Secondary | ICD-10-CM | POA: Diagnosis present

## 2017-10-12 DIAGNOSIS — I10 Essential (primary) hypertension: Secondary | ICD-10-CM | POA: Insufficient documentation

## 2017-10-12 DIAGNOSIS — Z79899 Other long term (current) drug therapy: Secondary | ICD-10-CM | POA: Insufficient documentation

## 2017-10-12 DIAGNOSIS — N760 Acute vaginitis: Secondary | ICD-10-CM

## 2017-10-12 DIAGNOSIS — K130 Diseases of lips: Secondary | ICD-10-CM | POA: Insufficient documentation

## 2017-10-12 HISTORY — DX: Obesity, unspecified: E66.9

## 2017-10-12 LAB — WET PREP, GENITAL
SPERM: NONE SEEN
TRICH WET PREP: NONE SEEN
YEAST WET PREP: NONE SEEN

## 2017-10-12 LAB — PREGNANCY, URINE: Preg Test, Ur: NEGATIVE

## 2017-10-12 MED ORDER — FLUCONAZOLE 100 MG PO TABS
200.0000 mg | ORAL_TABLET | Freq: Once | ORAL | Status: AC
  Administered 2017-10-12: 200 mg via ORAL
  Filled 2017-10-12: qty 2

## 2017-10-12 MED ORDER — METRONIDAZOLE 500 MG PO TABS: 500.0000 mg | ORAL_TABLET | Freq: Two times a day (BID) | ORAL | 0 refills | Status: DC

## 2017-10-12 MED FILL — metroNIDAZOLE 500 MG TABS: 500 | 7 days supply | Qty: 14 | Fill #0

## 2017-10-12 NOTE — Discharge Instructions (Signed)
It was our pleasure to provide your ER care today - we hope that you feel better.  Take flagyl (antibiotic) as prescribed - do not drink alcohol when taking this antibiotic.   Follow up with primary care doctor in 1 week if symptoms fail to improve/resolve.  Return to ER if worse, new symptoms, severe abdominal pain, fevers, other concern.

## 2017-10-12 NOTE — ED Triage Notes (Signed)
Vaginal itching and white discharge x4 days. Flap of skin inside upper lip where she had a piercing, x2 days.

## 2017-10-12 NOTE — ED Provider Notes (Signed)
[facility] EMERGENCY DEPARTMENT Provider Note   CSN: [id number] Arrival date & time: 10/12/17  0740     History   Chief Complaint Chief Complaint  Patient presents with  . Vaginal Itching    HPI Bailey Thomas is a 27 y.o. female.  Patient c/o vaginal itching and mild whitish discharge in the past 2 days. Remote hx yeast infection. No recent antibiotics. Denies abd or pelvic pain. No dysuria or hematuria. No vaginal bleeding. Having normal periods. Also notes small area hypertrophied mucosal tissue around upper lip at site of recent piercing. Pt has removed piercing. No discharge to area. No fever.   The history is provided by the patient.  Vaginal Itching  Pertinent negatives include no abdominal pain.    Past Medical History:  Diagnosis Date  . Hypertension   . Obesity     Patient Active Problem List   Diagnosis Date Noted  . History of cryosurgery 06/22/2017    Past Surgical History:  Procedure Laterality Date  . APPENDECTOMY    . CESAREAN SECTION      OB History    Gravida Para Term Preterm AB Living   3       2     SAB TAB Ectopic Multiple Live Births     2     1       Home Medications    Prior to Admission medications   Medication Sig Start Date End Date Taking? Authorizing Provider  doxycycline (DORYX) 100 MG EC tablet Take 1 tablet (100 mg total) by mouth 2 (two) times daily. 09/04/17   Everrett Coombe, MD  norgestimate-ethinyl estradiol (ORTHO-CYCLEN,SPRINTEC,PREVIFEM) 0.25-35 MG-MCG tablet Take 1 tablet by mouth daily. 09/04/17   Everrett Coombe, MD    Family History Family History  Problem Relation Age of Onset  . Hypertension Mother   . Diabetes Mother   . Cancer Neg Hx   . Stroke Neg Hx     Social History Social History   Tobacco Use  . Smoking status: Never Smoker  . Smokeless tobacco: Never Used  Substance Use Topics  . Alcohol use: Yes    Comment: social  . Drug use: No     Allergies   Patient has no known  allergies.   Review of Systems Review of Systems  Constitutional: Negative for fever.  HENT: Negative for sore throat.   Gastrointestinal: Negative for abdominal pain and vomiting.  Genitourinary: Positive for vaginal discharge. Negative for dysuria and pelvic pain.  Skin: Negative for rash.     Physical Exam Updated Vital Signs BP (!) 173/108 (BP Location: Right Arm)   Pulse 94   Temp 98.8 F (37.1 C) (Oral)   Resp 16   Ht 1.676 m (5\' 6" )   Wt 124.7 kg (275 lb)   LMP 09/23/2017 (Exact Date)   SpO2 100%   BMI 44.39 kg/m   Physical Exam  Constitutional: She appears well-developed and well-nourished. No distress.  HENT:  Mouth/Throat: Oropharynx is clear and moist.  Inner/mucosal surface of upper lip with small area (2-3 mm) of hypertrophied tissue around prior piercing site. No purulent drainage or infection to area.   Eyes: Conjunctivae are normal. No scleral icterus.  Neck: Neck supple. No tracheal deviation present.  Pulmonary/Chest: Effort normal. No respiratory distress.  Abdominal: Soft. Normal appearance. She exhibits no distension. There is no tenderness.  Genitourinary:  Genitourinary Comments: Normal external gu exam. Chaperoned pelvic exam. Mild whitish vaginal discharge, grossly appears c/w yeast. +mild odor.  No cmt.   Musculoskeletal: She exhibits no edema.  Neurological: She is alert.  Skin: Skin is warm and dry. No rash noted.  Psychiatric: She has a normal mood and affect.  Nursing note and vitals reviewed.    ED Treatments / Results  Labs (all labs ordered are listed, but only abnormal results are displayed) Results for orders placed or performed during the hospital encounter of 10/12/17  Pregnancy, urine  Result Value Ref Range   Preg Test, Ur NEGATIVE NEGATIVE    EKG  EKG Interpretation None       Radiology No results found.  Procedures Procedures (including critical care time)  Medications Ordered in ED Medications - No data to  display   Initial Impression / Assessment and Plan / ED Course  I have reviewed the triage vital signs and the nursing notes.  Pertinent labs & imaging results that were available during my care of the patient were reviewed by me and considered in my medical decision making (see chart for details).  Pt plans to leave piercing out. Discussed that area may involute/regress on own - if not, and remains source of irritation, f/u oral surgery as outpt.   Reviewed nursing notes and prior charts for additional history.   Fluconazole po.   Await wet prep.  Labs reviewed - Wet prep w clue cells - will give rx.   outpt f/u recheck bp as high today.     Final Clinical Impressions(s) / ED Diagnoses   Final diagnoses:  None    ED Discharge Orders    None       Lajean Saver, MD 10/12/17 [phone]

## 2017-10-13 LAB — GC/CHLAMYDIA PROBE AMP (~~LOC~~) NOT AT ARMC
Chlamydia: NEGATIVE
Neisseria Gonorrhea: NEGATIVE

## 2018-03-03 ENCOUNTER — Other Ambulatory Visit: Payer: Self-pay

## 2018-03-03 ENCOUNTER — Encounter (HOSPITAL_BASED_OUTPATIENT_CLINIC_OR_DEPARTMENT_OTHER): Payer: Self-pay | Admitting: Emergency Medicine

## 2018-03-03 ENCOUNTER — Emergency Department (HOSPITAL_BASED_OUTPATIENT_CLINIC_OR_DEPARTMENT_OTHER)
Admission: EM | Admit: 2018-03-03 | Discharge: 2018-03-03 | Disposition: A | Discharge status: Home / Self Care | Payer: Medicaid Other | Attending: Emergency Medicine | Admitting: Emergency Medicine

## 2018-03-03 DIAGNOSIS — I10 Essential (primary) hypertension: Secondary | ICD-10-CM | POA: Insufficient documentation

## 2018-03-03 DIAGNOSIS — T192XXA Foreign body in vulva and vagina, initial encounter: Secondary | ICD-10-CM | POA: Diagnosis present

## 2018-03-03 DIAGNOSIS — Y939 Activity, unspecified: Secondary | ICD-10-CM | POA: Diagnosis not present

## 2018-03-03 DIAGNOSIS — Y999 Unspecified external cause status: Secondary | ICD-10-CM | POA: Insufficient documentation

## 2018-03-03 DIAGNOSIS — Y929 Unspecified place or not applicable: Secondary | ICD-10-CM | POA: Insufficient documentation

## 2018-03-03 DIAGNOSIS — X58XXXA Exposure to other specified factors, initial encounter: Secondary | ICD-10-CM | POA: Diagnosis not present

## 2018-03-03 NOTE — ED Provider Notes (Signed)
[facility] EMERGENCY DEPARTMENT Provider Note   CSN: [id number] Arrival date & time: 03/03/18  0457     History   Chief Complaint Chief Complaint  Patient presents with  . Foreign Body in Skin    HPI Bailey Thomas is a 27 y.o. female.  The history is provided by the patient.  Foreign Body in Vagina  This is a new problem. The current episode started 6 to 12 hours ago (6 pm, known partner). The problem occurs constantly. The problem has not changed since onset.Pertinent negatives include no chest pain, no abdominal pain, no headaches and no shortness of breath. Nothing aggravates the symptoms. Nothing relieves the symptoms. She has tried nothing for the symptoms. The treatment provided no relief.  condom left in vagina since 6 pm.    Past Medical History:  Diagnosis Date  . Hypertension   . Obesity     Patient Active Problem List   Diagnosis Date Noted  . History of cryosurgery 06/22/2017    Past Surgical History:  Procedure Laterality Date  . APPENDECTOMY    . CESAREAN SECTION       OB History    Gravida  3   Para      Term      Preterm      AB  2   Living        SAB      TAB  2   Ectopic      Multiple      Live Births  1            Home Medications    Prior to Admission medications   Medication Sig Start Date End Date Taking? Authorizing Provider  doxycycline (DORYX) 100 MG EC tablet Take 1 tablet (100 mg total) by mouth 2 (two) times daily. 09/04/17   Everrett Coombe, MD  metroNIDAZOLE (FLAGYL) 500 MG tablet Take 1 tablet (500 mg total) by mouth 2 (two) times daily. 10/12/17   Lajean Saver, MD  norgestimate-ethinyl estradiol (ORTHO-CYCLEN,SPRINTEC,PREVIFEM) 0.25-35 MG-MCG tablet Take 1 tablet by mouth daily. 09/04/17   Everrett Coombe, MD    Family History Family History  Problem Relation Age of Onset  . Hypertension Mother   . Diabetes Mother   . Cancer Neg Hx   . Stroke Neg Hx     Social History Social History   Tobacco  Use  . Smoking status: Never Smoker  . Smokeless tobacco: Never Used  Substance Use Topics  . Alcohol use: Yes    Comment: social  . Drug use: No     Allergies   Patient has no known allergies.   Review of Systems Review of Systems  Respiratory: Negative for shortness of breath.   Cardiovascular: Negative for chest pain.  Gastrointestinal: Negative for abdominal pain.  Genitourinary: Negative for frequency, genital sores, hematuria, pelvic pain and urgency.  Neurological: Negative for headaches.  All other systems reviewed and are negative.    Physical Exam Updated Vital Signs BP (!) 172/110 (BP Location: Left Arm)   Pulse 85   Temp 98.5 F (36.9 C) (Oral)   Resp 18   Ht 5\' 6"  (1.676 m)   Wt 121.1 kg (267 lb)   SpO2 100%   BMI 43.09 kg/m   Physical Exam  Constitutional: She is oriented to person, place, and time. She appears well-developed and well-nourished. No distress.  HENT:  Head: Normocephalic and atraumatic.  Mouth/Throat: No oropharyngeal exudate.  Eyes: Conjunctivae and EOM are normal.  Neck: Normal range of motion. Neck supple.  Cardiovascular: Normal rate, regular rhythm, normal heart sounds and intact distal pulses.  No murmur heard. Pulmonary/Chest: Effort normal and breath sounds normal. No stridor. She has no wheezes. She has no rales.  Abdominal: Soft. Bowel sounds are normal. There is no tenderness.  Genitourinary:  Genitourinary Comments: Condom in vaginal vault chaperone present.    Musculoskeletal: Normal range of motion.  Neurological: She is alert and oriented to person, place, and time.  Skin: Skin is warm and dry. Capillary refill takes less than 2 seconds.     ED Treatments / Results  Labs (all labs ordered are listed, but only abnormal results are displayed) Labs Reviewed - No data to display  EKG None  Radiology No results found.  Procedures .Foreign Body Removal Date/Time: 03/03/2018 5:25 AM Performed by: Veatrice Kells, MD Authorized by: Veatrice Kells, MD  Consent: Verbal consent obtained. Risks and benefits: risks, benefits and alternatives were discussed Consent given by: patient Patient understanding: patient states understanding of the procedure being performed Patient identity confirmed: arm band Body area: vagina Anesthesia method: none.  Sedation: Patient sedated: no  Patient restrained: no Patient cooperative: yes Localization method: speculum Removal mechanism: ring forceps Complexity: simple 1 objects recovered. Objects recovered: condom Post-procedure assessment: foreign body removed Patient tolerance: Patient tolerated the procedure well with no immediate complications   (including critical care time)  Medications Ordered in ED Medications - No data to display     Final Clinical Impressions(s) / ED Diagnoses   Return for pain, numbness, changes in vision or speech, fevers >100.4 unrelieved by medication, shortness of breath, intractable vomiting, or diarrhea, abdominal pain, Inability to tolerate liquids or food, cough, altered mental status or any concerns. No signs of systemic illness or infection. The patient is nontoxic-appearing on exam and vital signs are within normal limits. Will refer to urology for microscopy hematuria as patient is asymptomatic.  I have reviewed the triage vital signs and the nursing notes. Pertinent labs &imaging results that were available during my care of the patient were reviewed by me and considered in my medical decision making (see chart for details).  After history, exam, and medical workup I feel the patient has been appropriately medically screened and is safe for discharge home. Pertinent diagnoses were discussed with the patient. Patient was given return precautions.    Viaan Knippenberg, MD 03/03/18 [phone]

## 2018-03-03 NOTE — ED Triage Notes (Signed)
Pt c/o having a foreign body (condom) stock on her vagina since last night unable to get it out. Denies any pain.

## 2019-06-14 ENCOUNTER — Emergency Department (HOSPITAL_BASED_OUTPATIENT_CLINIC_OR_DEPARTMENT_OTHER): Payer: Medicaid Other

## 2019-06-14 ENCOUNTER — Encounter (HOSPITAL_BASED_OUTPATIENT_CLINIC_OR_DEPARTMENT_OTHER): Payer: Self-pay

## 2019-06-14 ENCOUNTER — Emergency Department (HOSPITAL_BASED_OUTPATIENT_CLINIC_OR_DEPARTMENT_OTHER)
Admission: EM | Admit: 2019-06-14 | Discharge: 2019-06-14 | Disposition: A | Discharge status: Home / Self Care | Payer: Medicaid Other | Attending: Emergency Medicine | Admitting: Emergency Medicine

## 2019-06-14 ENCOUNTER — Other Ambulatory Visit: Payer: Self-pay

## 2019-06-14 DIAGNOSIS — Z3201 Encounter for pregnancy test, result positive: Secondary | ICD-10-CM | POA: Insufficient documentation

## 2019-06-14 DIAGNOSIS — O10011 Pre-existing essential hypertension complicating pregnancy, first trimester: Secondary | ICD-10-CM | POA: Insufficient documentation

## 2019-06-14 DIAGNOSIS — O209 Hemorrhage in early pregnancy, unspecified: Secondary | ICD-10-CM | POA: Diagnosis present

## 2019-06-14 DIAGNOSIS — O200 Threatened abortion: Secondary | ICD-10-CM | POA: Insufficient documentation

## 2019-06-14 DIAGNOSIS — Z3A01 Less than 8 weeks gestation of pregnancy: Secondary | ICD-10-CM | POA: Diagnosis not present

## 2019-06-14 DIAGNOSIS — Z79899 Other long term (current) drug therapy: Secondary | ICD-10-CM | POA: Insufficient documentation

## 2019-06-14 DIAGNOSIS — Z3490 Encounter for supervision of normal pregnancy, unspecified, unspecified trimester: Secondary | ICD-10-CM

## 2019-06-14 LAB — CBC
HCT: 39.1 % (ref 36.0–46.0)
Hemoglobin: 13.4 g/dL (ref 12.0–15.0)
MCH: 30 pg (ref 26.0–34.0)
MCHC: 34.3 g/dL (ref 30.0–36.0)
MCV: 87.7 fL (ref 80.0–100.0)
Platelets: 181 10*3/uL (ref 150–400)
RBC: 4.46 MIL/uL (ref 3.87–5.11)
RDW: 13 % (ref 11.5–15.5)
WBC: 6.5 10*3/uL (ref 4.0–10.5)
nRBC: 0 % (ref 0.0–0.2)

## 2019-06-14 LAB — BASIC METABOLIC PANEL
Anion gap: 15 (ref 5–15)
BUN: 7 mg/dL (ref 6–20)
CO2: 21 mmol/L — ABNORMAL LOW (ref 22–32)
Calcium: 9.5 mg/dL (ref 8.9–10.3)
Chloride: 100 mmol/L (ref 98–111)
Creatinine, Ser: 0.49 mg/dL (ref 0.44–1.00)
GFR calc Af Amer: >60 mL/min (ref >60)
GFR calc non Af Amer: >60 mL/min (ref >60)
Glucose, Bld: 88 mg/dL (ref 70–99)
Potassium: 3.4 mmol/L — ABNORMAL LOW (ref 3.5–5.1)
Sodium: 136 mmol/L (ref 135–145)

## 2019-06-14 LAB — ABO/RH: ABO/RH(D): A POS

## 2019-06-14 LAB — HCG, QUANTITATIVE, PREGNANCY: hCG, Beta Chain, Quant, S: 43147 m[IU]/mL — ABNORMAL HIGH (ref <5)

## 2019-06-14 LAB — PREGNANCY, URINE: Preg Test, Ur: POSITIVE — AB

## 2019-06-14 MED ORDER — ONDANSETRON HCL 4 MG/2ML IJ SOLN
4.0000 mg | Freq: Once | INTRAMUSCULAR | Status: AC
  Administered 2019-06-14: 4 mg via INTRAVENOUS
  Filled 2019-06-14: qty 2

## 2019-06-14 MED ORDER — ONDANSETRON HCL 4 MG PO TABS: 4.0000 mg | ORAL_TABLET | Freq: Four times a day (QID) | ORAL | 0 refills | Status: AC

## 2019-06-14 NOTE — ED Provider Notes (Signed)
[facility] EMERGENCY DEPARTMENT Provider Note   CSN: [id number] Arrival date & time: 06/14/19  1223     History   Chief Complaint Chief Complaint  Patient presents with  . Vaginal Bleeding    HPI Bailey Thomas is a 28 y.o. female.  G5, P1 at 6 weeks by LMP presents emergency department with chief complaint vaginal bleeding.  Had at positive at home pregnancy test.  States since this morning has noted small vaginal bleeding, has not been soaking through pads.  Has noted some lower abdominal cramping, mild pain at this time.  States that she has been having daily nausea and vomiting since being pregnant, no change in this today.     HPI  Past Medical History:  Diagnosis Date  . Hypertension   . Obesity     Patient Active Problem List   Diagnosis Date Noted  . History of cryosurgery 06/22/2017    Past Surgical History:  Procedure Laterality Date  . APPENDECTOMY    . CESAREAN SECTION    . GYNECOLOGIC CRYOSURGERY    . INDUCED ABORTION       OB History    Gravida  5   Para  1   Term      Preterm      AB  3   Living        SAB      TAB  3   Ectopic      Multiple      Live Births  1            Home Medications    Prior to Admission medications   Medication Sig Start Date End Date Taking? Authorizing Provider  doxycycline (DORYX) 100 MG EC tablet Take 1 tablet (100 mg total) by mouth 2 (two) times daily. 09/04/17   Everrett Coombe, MD  metroNIDAZOLE (FLAGYL) 500 MG tablet Take 1 tablet (500 mg total) by mouth 2 (two) times daily. 10/12/17   Lajean Saver, MD  norgestimate-ethinyl estradiol (ORTHO-CYCLEN,SPRINTEC,PREVIFEM) 0.25-35 MG-MCG tablet Take 1 tablet by mouth daily. 09/04/17   Everrett Coombe, MD    Family History Family History  Problem Relation Age of Onset  . Hypertension Mother   . Diabetes Mother   . Cancer Neg Hx   . Stroke Neg Hx     Social History Social History   Tobacco Use  . Smoking status: Never Smoker  .  Smokeless tobacco: Never Used  Substance Use Topics  . Alcohol use: Yes    Comment: occ  . Drug use: No     Allergies   Patient has no known allergies.   Review of Systems Review of Systems  Constitutional: Negative for chills and fever.  HENT: Negative for ear pain and sore throat.   Eyes: Negative for pain and visual disturbance.  Respiratory: Negative for cough and shortness of breath.   Cardiovascular: Negative for chest pain and palpitations.  Gastrointestinal: Positive for abdominal pain. Negative for vomiting.  Genitourinary: Negative for dysuria and hematuria.  Musculoskeletal: Negative for arthralgias and back pain.  Skin: Negative for color change and rash.  Neurological: Negative for seizures and syncope.  All other systems reviewed and are negative.    Physical Exam Updated Vital Signs BP (!) 144/79 (BP Location: Left Arm)   Pulse 61   Temp 99 F (37.2 C) (Oral)   Resp 20   Ht 5\' 6"  (1.676 m)   Wt 94.3 kg   LMP 05/04/2019   SpO2 100%  BMI 33.57 kg/m   Physical Exam Vitals signs and nursing note reviewed.  Constitutional:      General: She is not in acute distress.    Appearance: She is well-developed.  HENT:     Head: Normocephalic and atraumatic.  Eyes:     Conjunctiva/sclera: Conjunctivae normal.  Neck:     Musculoskeletal: Neck supple.  Cardiovascular:     Rate and Rhythm: Normal rate and regular rhythm.     Heart sounds: No murmur.  Pulmonary:     Effort: Pulmonary effort is normal. No respiratory distress.     Breath sounds: Normal breath sounds.  Abdominal:     Palpations: Abdomen is soft.     Tenderness: There is no abdominal tenderness.  Genitourinary:    Comments: Small pooling of blood in vagina, closed cervical os, no active bleeding, no products of conception, no clots Skin:    General: Skin is warm and dry.  Neurological:     General: No focal deficit present.     Mental Status: She is alert and oriented to person, place,  and time.      ED Treatments / Results  Labs (all labs ordered are listed, but only abnormal results are displayed) Labs Reviewed  PREGNANCY, URINE - Abnormal; Notable for the following components:      Result Value   Preg Test, Ur POSITIVE (*)    All other components within normal limits  HCG, QUANTITATIVE, PREGNANCY - Abnormal; Notable for the following components:   hCG, Beta Chain, Quant, S 43,147 (*)    All other components within normal limits  CBC  BASIC METABOLIC PANEL  ABO/RH    EKG None  Radiology US Ob Transvaginal  Result Date: 06/14/2019 CLINICAL DATA:  Bleeding, cramps EXAM: TRANSVAGINAL OB ULTRASOUND TECHNIQUE: Transvaginal ultrasound was performed for complete evaluation of the gestation as well as the maternal uterus, adnexal regions, and pelvic cul-de-sac. COMPARISON:  None. FINDINGS: Intrauterine gestational sac: Single Yolk sac:  Visualized Embryo:  Visualized Cardiac Activity: Visualized Heart Rate: 120 bpm MSD: 17.4 mm   6 w   4 d CRL:   7.2 mm   6 w for d                  [place] EDC: 02/03/2020 Subchorionic hemorrhage: Small-moderate sized subchorionic hemorrhage measuring approximately 12 mm. Maternal uterus/adnexae: Right ovarian hypoechoic mass consistent with a corpus luteum cyst. Multiple hypoechoic small uterine masses with the largest measuring 1.7 x 1.5 x 1.5 cm in the left side of the fundus consistent with small fibroids. No pelvic free fluid. IMPRESSION: 1. Single live intrauterine pregnancy as detailed above. 2. Small-moderate sized subchorionic hemorrhage. Electronically Signed   By: Kathreen Devoid   On: 06/14/2019 14:46    Procedures Ultrasound ED OB Pelvic  Date/Time: 06/14/2019 3:45 PM Performed by: Lucrezia Starch, MD Authorized by: Lucrezia Starch, MD   Procedure details:    Indications: evaluate for IUP, pregnant with abdominal pain and pregnant with vaginal bleeding     Assess:  Intrauterine pregnancy   Technique:  Transabdominal  obstetric (HCG+) exam   Images: archived    Uterine findings:    Intrauterine pregnancy: identified     Single gestation: identified     Gestational sac: identified     Yolk sac: identified     Fetal pole: identified     Fetal heart rate: not identified      Left ovary findings:    Adnexal mass: not identified  Right ovary findings:     Adnexal mass: not identified Other findings:    Free pelvic fluid: not identified     Free peritoneal fluid: not identified   Comments:     Identified IUP, unable to get FHT   (including critical care time)  Medications Ordered in ED Medications  ondansetron (ZOFRAN) injection 4 mg (4 mg Intravenous Given 06/14/19 1533)     Initial Impression / Assessment and Plan / ED Course  I have reviewed the triage vital signs and the nursing notes.  Pertinent labs & imaging results that were available during my care of the patient were reviewed by me and considered in my medical decision making (see chart for details).  Clinical Course as of Jun 14 1547  Wed Jun 14, 2019  1446 Completed transabdominal US, pelvic exam as chaperoned by RN Joycelyn Schmid   [RD]    Clinical Course User Index [RD] Lucrezia Starch, MD       28 G5 P1 at 6 weeks by LMP presents to ER with vaginal bleeding.  Performed bedside ultrasound initially, identified single IUP but could not visualize fetus reliably.  Therefore pain transvaginal ultrasound which identified single IUP, crown-rump length corresponding to dates.  Also Not able to detect FHT.  Consistent with threatened abortion.  Patient is Rh+, no indication for RhoGam.  Updated patient on results, hemodynamically stable with normal hemoglobin, appropriate for discharge and outpatient management this time.  Recommend recheck with her gynecologist.  Reviewed precautions.  After the discussed management above, the patient was determined to be safe for discharge.  The patient was in agreement with this plan and all questions  regarding their care were answered.  ED return precautions were discussed and the patient will return to the ED with any significant worsening of condition.   Final Clinical Impressions(s) / ED Diagnoses   Final diagnoses:  Threatened abortion in first trimester    ED Discharge Orders    None       Lucrezia Starch, MD 06/14/19 [phone]

## 2019-06-14 NOTE — ED Triage Notes (Signed)
Pt c/o vaginal bleeding started just PTA-0 pad count-pt states LMP 9/10-pos home preg test ~1 week ago-NAD-steady gait

## 2019-06-14 NOTE — ED Notes (Signed)
Pt given graham crackers and sprite 

## 2019-06-14 NOTE — Discharge Instructions (Addendum)
Recommend scheduling an appointment with your gynecologist for recheck in the next couple days.  If you develop worsening bleeding, abdominal pain, worsening vomiting, episodes of passing out or other new concerning symptom, recommend return to ER.

## 2020-09-21 ENCOUNTER — Inpatient Hospital Stay (HOSPITAL_COMMUNITY)
Admission: EM | Admit: 2020-09-21 | Discharge: 2020-09-21 | Disposition: A | Discharge status: Home / Self Care | Payer: Self-pay | Attending: Obstetrics and Gynecology | Admitting: Obstetrics and Gynecology

## 2020-09-21 ENCOUNTER — Inpatient Hospital Stay (HOSPITAL_COMMUNITY): Payer: Self-pay

## 2020-09-21 ENCOUNTER — Encounter (HOSPITAL_COMMUNITY): Payer: Self-pay | Admitting: Emergency Medicine

## 2020-09-21 ENCOUNTER — Other Ambulatory Visit: Payer: Self-pay

## 2020-09-21 DIAGNOSIS — Z3202 Encounter for pregnancy test, result negative: Secondary | ICD-10-CM | POA: Insufficient documentation

## 2020-09-21 DIAGNOSIS — I10 Essential (primary) hypertension: Secondary | ICD-10-CM | POA: Insufficient documentation

## 2020-09-21 DIAGNOSIS — N939 Abnormal uterine and vaginal bleeding, unspecified: Secondary | ICD-10-CM | POA: Insufficient documentation

## 2020-09-21 DIAGNOSIS — O209 Hemorrhage in early pregnancy, unspecified: Secondary | ICD-10-CM

## 2020-09-21 DIAGNOSIS — Z79899 Other long term (current) drug therapy: Secondary | ICD-10-CM | POA: Insufficient documentation

## 2020-09-21 LAB — CBC
HCT: 37.5 % (ref 36.0–46.0)
Hemoglobin: 12.6 g/dL (ref 12.0–15.0)
MCH: 30 pg (ref 26.0–34.0)
MCHC: 33.6 g/dL (ref 30.0–36.0)
MCV: 89.3 fL (ref 80.0–100.0)
Platelets: 188 K/uL (ref 150–400)
RBC: 4.2 MIL/uL (ref 3.87–5.11)
RDW: 12.9 % (ref 11.5–15.5)
WBC: 5.3 K/uL (ref 4.0–10.5)
nRBC: 0 % (ref 0.0–0.2)

## 2020-09-21 LAB — HCG, QUANTITATIVE, PREGNANCY: hCG, Beta Chain, Quant, S: <1 m[IU]/mL (ref <5)

## 2020-09-21 MED ORDER — CEPHALEXIN 500 MG PO CAPS: 500.0000 mg | ORAL_CAPSULE | Freq: Four times a day (QID) | ORAL | 0 refills | Status: AC

## 2020-09-21 NOTE — ED Triage Notes (Signed)
Emergency Medicine Provider OB Triage Evaluation Note  Silveria Botz is a 30 y.o. female, G5P0030, at Unknown gestation who presents to the emergency department with complaints of vaginal bleeding x 16 days; pt went to UC earlier today for same and had a positive pregnancy test. She does mention taking the morning after pill after having unprotected intercourse last month and did not think much of it; she was concerned regarding the bleeding prompting her UC visit. She was advised to come to the ED for further evaluation. Pt reports she has been having some abdominal cramping that has felt like her typical menstrual cycle. No other complaints. LNMP December 2021.   Review of  Systems  Positive: + vaginal bleeding, + abdominal cramping Negative: - nausea, - vomiting, - discharge  Physical Exam  BP (!) 170/88 (BP Location: Right Arm)   Pulse 70   Temp 98.4 F (36.9 C) (Oral)   Resp 18   SpO2 98%  General: Awake, no distress  HEENT: Atraumatic  Resp: Normal effort  Cardiac: Normal rate Abd: Nondistended, nontender  MSK: Moves all extremities without difficulty Neuro: Speech clear  Medical Decision Making  Pt evaluated for pregnancy concern and is stable for transfer to MAU. Pt is in agreement with plan for transfer.  12:48 PM Discussed with MAU APP, Sharolyn Douglas, who accepts patient in transfer.  Clinical Impression  No diagnosis found. 30 year old female who presents to the ED today for vaginal bleeding for the past 16 days with positive pregnancy test at urgent care earlier today.  I am able to see this in our system, has had some abdominal cramping as well.  Per note from urgent care patient has had 7 pregnancies, 6 abortions, one living child.  This will make her eighth pregnancy.  Vitals are stable at this time.  Patient to be transferred over to the MAU for further evaluation.    Eustaquio Maize, PA-C 09/21/20 1250

## 2020-09-21 NOTE — ED Triage Notes (Signed)
Pt states she went to [facility] due to vaginal bleeding x 16 days and her pregnancy test was positive. PA notified to screen pt for MAU.

## 2020-09-21 NOTE — MAU Note (Signed)
.   Bailey Thomas is a 30 y.o. here in MAU reporting: vaginal bleeding that has lasted for x16 days. States that she had unprotected sex on 08/12/20 and took a plan B afterward. States she went to urgent care because she had bleeding lasting so long and had a positive pregnancy test. Patient also had chronic HTN, took her meds this morning.   Pain score: 5 Vitals:   09/21/20 1218 09/21/20 1327  BP: (!) 170/88 (!) 171/97  Pulse: 70 68  Resp: 18 15  Temp: 98.4 F (36.9 C) 98.8 F (37.1 C)  SpO2: 98% 100%

## 2020-09-21 NOTE — Discharge Instructions (Signed)
Abnormal Uterine Bleeding Abnormal uterine bleeding means bleeding more than usual from your womb (uterus). It can include:  Bleeding between menstrual periods.  Bleeding after sex.  Bleeding that is heavier than normal.  Menstrual periods that last longer than usual.  Bleeding after you have stopped having your menstrual period (menopause). There are many problems that may cause this. You should see a doctor for any kind of bleeding that is not normal. Treatment depends on the cause of the bleeding. Follow these instructions at home: Medicines  Take over-the-counter and prescription medicines only as told by your doctor.  Tell your doctor about other medicines that you take. ? If told by your doctor, stop taking aspirin or medicines that have aspirin in them. These medicines can make you bleed more.  You may be given iron pills to replace iron that your body loses because of this condition. Take them as told by your doctor. Managing constipation If you are taking iron pills, you may have trouble pooping (constipation). To prevent or treat trouble pooping, you may need to:  Drink enough fluid to keep your pee (urine) pale yellow.  Take over-the-counter or prescription medicines.  Eat foods that are high in fiber. These include beans, whole grains, and fresh fruits and vegetables.  Limit foods that are high in fat and sugar. These include fried or sweet foods. General instructions  Watch your condition for any changes.  Do not use tampons, douche, or have sex, if your doctor tells you not to.  Change your pads often.  Get regular exams. This includes pelvic exams and cervical cancer screenings. ? It is up to you to get the results of any tests that are done. Ask your doctor, or the department that is doing the tests, when your results will be ready.  Keep all follow-up visits as told by your doctor. This is important. Contact a doctor if:  The bleeding lasts more than 1  week.  You feel dizzy at times.  You feel like you may vomit (nausea).  You vomit.  You feel light-headed or weak.  Your symptoms get worse. Get help right away if:  You pass out.  You have to change pads every hour.  You have pain in your belly.  You have a fever or chills.  You get sweaty.  You get weak.  You pass large blood clots from your vagina. Summary  Abnormal uterine bleeding means bleeding more than usual from your womb (uterus).  Any kind of bleeding that is not normal should be checked by a doctor.  Treatment depends on the cause of the bleeding.  Get help right away if you pass out, you have to change pads every hour, or you pass large blood clots from your vagina. This information is not intended to replace advice given to you by your health care provider. Make sure you discuss any questions you have with your health care provider. Document Revised: 06/13/2019 Document Reviewed: 06/13/2019 Elsevier Patient Education  2021 Elsevier Inc.  

## 2020-09-21 NOTE — MAU Provider Note (Signed)
History     CSN: [id number]  Arrival date and time: 09/21/20 1207   Event Date/Time   First Provider Initiated Contact with Patient 09/21/20 1524      Chief Complaint  Patient presents with  . Vaginal Bleeding   HPI Bailey Thomas is a 30 y.o. [id number] at Unknown who presents with vaginal bleeding. She states she took a plan B pill last month and now she has been bleeding for 16 days. She reports some days are heavy and others are just spotting. She reports intermittent abdominal cramping. She went to [facility] urgent care and was told she had a positive pregnancy test and a UTI but not was not treated for the UTI. She was instructed to come to MAU for evaluation.   OB History    Gravida  6   Para  1   Term      Preterm      AB  3   Living        SAB      IAB  3   Ectopic      Multiple      Live Births  1           Past Medical History:  Diagnosis Date  . Hypertension   . Obesity     Past Surgical History:  Procedure Laterality Date  . APPENDECTOMY    . CESAREAN SECTION    . GYNECOLOGIC CRYOSURGERY    . INDUCED ABORTION      Family History  Problem Relation Age of Onset  . Hypertension Mother   . Diabetes Mother   . Cancer Neg Hx   . Stroke Neg Hx     Social History   Tobacco Use  . Smoking status: Never Smoker  . Smokeless tobacco: Never Used  Vaping Use  . Vaping Use: Never used  Substance Use Topics  . Alcohol use: Yes    Comment: occ  . Drug use: No    Allergies: No Known Allergies  Medications Prior to Admission  Medication Sig Dispense Refill Last Dose  . doxycycline (DORYX) 100 MG EC tablet Take 1 tablet (100 mg total) by mouth 2 (two) times daily. 13 tablet 0   . metroNIDAZOLE (FLAGYL) 500 MG tablet Take 1 tablet (500 mg total) by mouth 2 (two) times daily. 14 tablet 0   . norgestimate-ethinyl estradiol (ORTHO-CYCLEN,SPRINTEC,PREVIFEM) 0.25-35 MG-MCG tablet Take 1 tablet by mouth daily. 1 Package 11   . ondansetron (ZOFRAN) 4  MG tablet Take 1 tablet (4 mg total) by mouth every 6 (six) hours. 12 tablet 0     Review of Systems  Constitutional: Negative.  Negative for fatigue and fever.  HENT: Negative.   Respiratory: Negative.  Negative for shortness of breath.   Cardiovascular: Negative.  Negative for chest pain.  Gastrointestinal: Positive for abdominal pain. Negative for constipation, diarrhea, nausea and vomiting.  Genitourinary: Positive for vaginal bleeding. Negative for dysuria.  Neurological: Negative.  Negative for dizziness and headaches.   Physical Exam   Blood pressure (!) 171/97, pulse 68, temperature 98.8 F (37.1 C), temperature source Oral, resp. rate 15, height 5\' 6"  (1.676 m), weight 100 kg, SpO2 100 %.  Physical Exam Vitals and nursing note reviewed.  Constitutional:      General: She is not in acute distress.    Appearance: She is well-developed and well-nourished.  HENT:     Head: Normocephalic.  Eyes:     Pupils: Pupils are equal, round, and reactive  to light.  Cardiovascular:     Rate and Rhythm: Normal rate and regular rhythm.     Heart sounds: Normal heart sounds.  Pulmonary:     Effort: Pulmonary effort is normal. No respiratory distress.     Breath sounds: Normal breath sounds.  Abdominal:     General: Bowel sounds are normal. There is no distension.     Palpations: Abdomen is soft.     Tenderness: There is no abdominal tenderness.  Genitourinary:    Comments: Scant blood on exam Skin:    General: Skin is warm and dry.  Neurological:     Mental Status: She is alert and oriented to person, place, and time.  Psychiatric:        Mood and Affect: Mood and affect normal.        Behavior: Behavior normal.        Thought Content: Thought content normal.        Judgment: Judgment normal.     MAU Course  Procedures Results for orders placed or performed during the hospital encounter of 09/21/20 (from the past 24 hour(s))  CBC     Status: None   Collection Time:  09/21/20  1:45 PM  Result Value Ref Range   WBC 5.3 4.0 - 10.5 K/uL   RBC 4.20 3.87 - 5.11 MIL/uL   Hemoglobin 12.6 12.0 - 15.0 g/dL   HCT 37.5 36.0 - 46.0 %   MCV 89.3 80.0 - 100.0 fL   MCH 30.0 26.0 - 34.0 pg   MCHC 33.6 30.0 - 36.0 g/dL   RDW 12.9 11.5 - 15.5 %   Platelets 188 150 - 400 K/uL   nRBC 0.0 0.0 - 0.2 %  hCG, quantitative, pregnancy     Status: None   Collection Time: 09/21/20  1:45 PM  Result Value Ref Range   hCG, Beta Chain, Quant, S <1 <5 mIU/mL   US OB LESS THAN 14 WEEKS WITH OB TRANSVAGINAL  Result Date: 09/21/2020 CLINICAL DATA:  First trimester pregnancy, bleeding, unknown dates; no quantitative beta HCG for correlation; had unprotected sex on 08/12/2020, took Plan B afterwards; history Caesarean section EXAM: OBSTETRIC <14 WK [place] AND TRANSVAGINAL OB US TECHNIQUE: Both transabdominal and transvaginal ultrasound examinations were performed for complete evaluation of the gestation as well as the maternal uterus, adnexal regions, and pelvic cul-de-sac. Transvaginal technique was performed to assess early pregnancy. COMPARISON:  None FINDINGS: Intrauterine gestational sac: Absent Yolk sac:  N/A Embryo:  N/A Cardiac Activity: N/A Heart Rate: N/A  bpm MSD:   mm    w     d CRL:    mm    w    d                  [place] EDC: Subchorionic hemorrhage:  N/A Maternal uterus/adnexae: Uterus anteverted, with anterior wall Caesarean section scar. Two small probable uterine leiomyomata, intramural, 1.2 cm anterior mid uterus and 9 mm intramural at posterior fundus. Endometrial complex normal appearance 3 mm diameter. No endometrial fluid or gestational sac. RIGHT ovary normal size and morphology, 2.1 x 3.0 x 2.1 cm. LEFT ovary normal size and morphology, 3.4 x 4.1 x 2.6 cm. No adnexal masses or free pelvic fluid. IMPRESSION: No intrauterine gestation identified. Findings are consistent with pregnancy of unknown location. Differential diagnosis includes early intrauterine pregnancy too early to  visualize, spontaneous abortion, and ectopic pregnancy. Serial quantitative beta HCG and or follow-up ultrasound recommended to definitively exclude ectopic pregnancy. Electronically  Signed   By: Lavonia Dana M.D.   On: 09/21/2020 15:11   MDM Review of labs from Urgent care visit today. Able to see positive UPT and UA with nitrites.  CBC, HCG ABO/Rh- A Pos Wet prep and gc/chlamydia US OB Comp Less 14 weeks with Transvaginal  Attempted to call urgent care facility to review results but unable to reach anyone by phone.   HCG resulted as negative. Discussed results with patient that she is not currently pregnant and has not been pregnant recently. Apologized for long MAU stay with extended work up. Discussed with patient that abnormal bleeding could be from taking Plan B or small fibriods in uterus. Encouraged patient to follow up with gyn if bleeding continues. Patient requested CNM treat UTI diagnosed at urgent care. Discussed lack of confidence in urine results left at [facility] but patient desires treatment. Will send antibiotics to pharmacy.  Assessment and Plan   1. Negative pregnancy test   2. Vaginal bleeding affecting early pregnancy   3. Abnormal uterine bleeding    -Discharge home in stable condition -AUB precautions discussed -Patient advised to follow-up with gyn as needed  -Patient may return to MAU as needed or if her condition were to change or worsen  Wende Mott CNM 09/21/2020, 3:24 PM

## 2020-11-15 ENCOUNTER — Ambulatory Visit (INDEPENDENT_AMBULATORY_CARE_PROVIDER_SITE_OTHER): Payer: Self-pay | Admitting: Obstetrics and Gynecology

## 2020-11-15 ENCOUNTER — Other Ambulatory Visit: Payer: Self-pay

## 2020-11-15 ENCOUNTER — Encounter: Payer: Self-pay | Admitting: Obstetrics and Gynecology

## 2020-11-15 DIAGNOSIS — D251 Intramural leiomyoma of uterus: Secondary | ICD-10-CM

## 2020-11-15 DIAGNOSIS — N926 Irregular menstruation, unspecified: Secondary | ICD-10-CM | POA: Insufficient documentation

## 2020-11-15 DIAGNOSIS — D259 Leiomyoma of uterus, unspecified: Secondary | ICD-10-CM | POA: Insufficient documentation

## 2020-11-15 NOTE — Patient Instructions (Signed)
Uterine Fibroids  Uterine fibroids, also called leiomyomas, are noncancerous (benign) tumors that can grow in the uterus. They can cause heavy menstrual bleeding and pain. Fibroids may also grow in the fallopian tubes, cervix, or tissues (ligaments) near the uterus. You may have one or many fibroids. Fibroids vary in size, weight, and where they grow in the uterus. Some can become quite large. Most fibroids do not require medical treatment. What are the causes? The cause of this condition is not known. What increases the risk? You are more likely to develop this condition if you:  Are in your 30s or 40s and have not gone through menopause.  Have a family history of this condition.  Are of African American descent.  Started your menstrual period at age 70 or younger.  Have never given birth.  Are overweight or obese. What are the signs or symptoms? Many women do not have any symptoms. Symptoms of this condition may include:  Heavy menstrual bleeding.  Bleeding between menstrual periods.  Pain and pressure in the pelvic area, between your hip bones.  Pain during sex.  Bladder problems, such as needing to urinate right away or more often than usual.  Inability to have children (infertility).  Failure to carry pregnancy to term (miscarriage). How is this diagnosed? This condition may be diagnosed based on:  Your symptoms and medical history.  A physical exam.  A pelvic exam that includes feeling for any tumors.  Imaging tests, such as ultrasound or MRI. How is this treated? Treatment for this condition may include follow-up visits with your health care provider to monitor your fibroids for any changes. Other treatment may include:  Medicines, such as: ? Medicines to relieve pain, including aspirin and NSAIDs, such as ibuprofen or naproxen. ? Hormone therapy. Treatment may be given as a pill or an injection, or it may be inserted into the uterus using an intrauterine  device (IUD).  Surgery that would do one of the following: ? Remove the fibroids (myomectomy). This may be recommended if fibroids affect your fertility and you want to become pregnant. ? Remove the uterus (hysterectomy). ? Block the blood supply to the fibroids (uterine artery embolization). This can cause them to shrink and die. Follow these instructions at home: Medicines  Take over-the-counter and prescription medicines only as told by your health care provider.  Ask your health care provider if you should take iron pills or eat more iron-rich foods, such as dark green, leafy vegetables. Heavy menstrual bleeding can cause low iron levels. Managing pain If directed, apply heat to your back or abdomen to reduce pain. Use the heat source that your health care provider recommends, such as a moist heat pack or a heating pad. To apply heat:  Place a towel between your skin and the heat source.  Leave the heat on for 20-30 minutes.  Remove the heat if your skin turns bright red. This is especially important if you are unable to feel pain, heat, or cold. You may have a greater risk of getting burned.   General instructions  Pay close attention to your menstrual cycle. Tell your health care provider about any changes, such as: ? Heavier bleeding that requires you to change your pads or tampons more than usual. ? A change in the number of days that your menstrual period lasts. ? A change in symptoms that come with your menstrual period, such as back pain or cramps in your abdomen.  Keep all follow-up visits. This is  important, especially if your fibroids need to be monitored for any changes. Contact a health care provider if you:  Have pelvic pain, back pain, or cramps in your abdomen that do not get better with medicine or heat.  Develop new bleeding between menstrual periods.  Have increased bleeding during or between menstrual periods.  Feel more tired or weak than usual.  Feel  light-headed. Get help right away if you:  Faint.  Have pelvic pain that suddenly gets worse.  Have severe vaginal bleeding that soaks a tampon or pad in 30 minutes or less. Summary  Uterine fibroids are noncancerous (benign) tumors that can develop in the uterus.  The exact cause of this condition is not known.  Most fibroids do not require medical treatment unless they affect your ability to have children (fertility).  Contact a health care provider if you have pelvic pain, back pain, or cramps in your abdomen that do not get better with medicines.  Get help right away if you faint, have pelvic pain that suddenly gets worse, or have severe vaginal bleeding. This information is not intended to replace advice given to you by your health care provider. Make sure you discuss any questions you have with your health care provider. Document Revised: 03/12/2020 Document Reviewed: 03/12/2020 Elsevier Patient Education  [employer].

## 2020-11-15 NOTE — Progress Notes (Signed)
CC: irregular menses Subjective:    Patient ID: Bailey Thomas, female    DOB: 06-08-1991, 30 y.o.   MRN: [id number]  HPI 30 yo G6P1, c/s x 1, seen for discussion of irregular menses at the end of January.  Per MAU note, pt was seen for irregular bleeding after usage of plan B pill.  Pt was concerned that fibroids were seen during her workup.  Of note, pt observed regular menses before and after the January incident.  Pt has mild discomfort during menses as well.   Review of Systems  Constitutional: Negative.   HENT: Negative.   Eyes: Negative.   Respiratory: Negative.   Cardiovascular: Negative.   Gastrointestinal: Negative.   Genitourinary: Positive for menstrual problem. Negative for dyspareunia and pelvic pain.  Musculoskeletal: Negative.   Neurological: Negative.   Hematological: Negative.        Objective:   Physical Exam Vitals:   11/15/20 0823  BP: (!) 157/110  Pulse: 71   Physical exam deferred   CLINICAL DATA:  First trimester pregnancy, bleeding, unknown dates; no quantitative beta HCG for correlation; had unprotected sex on 08/12/2020, took Plan B afterwards; history Caesarean section   EXAM: OBSTETRIC <14 WK [place] AND TRANSVAGINAL OB US   TECHNIQUE: Both transabdominal and transvaginal ultrasound examinations were performed for complete evaluation of the gestation as well as the maternal uterus, adnexal regions, and pelvic cul-de-sac. Transvaginal technique was performed to assess early pregnancy.   COMPARISON:  None   FINDINGS: Intrauterine gestational sac: Absent   Yolk sac:  N/A   Embryo:  N/A   Cardiac Activity: N/A   Heart Rate: N/A  bpm   MSD:   mm    w     d   CRL:    mm    w    d                  [place] EDC:   Subchorionic hemorrhage:  N/A   Maternal uterus/adnexae:   Uterus anteverted, with anterior wall Caesarean section scar.   Two small probable uterine leiomyomata, intramural, 1.2 cm anterior mid uterus and 9 mm intramural at  posterior fundus.   Endometrial complex normal appearance 3 mm diameter.   No endometrial fluid or gestational sac.   RIGHT ovary normal size and morphology, 2.1 x 3.0 x 2.1 cm.   LEFT ovary normal size and morphology, 3.4 x 4.1 x 2.6 cm.   No adnexal masses or free pelvic fluid.   IMPRESSION: No intrauterine gestation identified.   Findings are consistent with pregnancy of unknown location.   Differential diagnosis includes early intrauterine pregnancy too early to visualize, spontaneous abortion, and ectopic pregnancy.   Serial quantitative beta HCG and or follow-up ultrasound recommended to definitively exclude ectopic pregnancy.      Assessment & Plan:   1. Irregular menses Menstrual irregularity appears to have resolved  2. Intramural leiomyoma of uterus Discussed fibroids in detail.  Do not believe these small fibroids are the cause of the irregular bleeding incident or any pelvic pain.  They can be monitored expectantly.    Pt needs to schedule for AE/Pap as she is overdue, but she is waiting for her insurance to be verified. Pt also counseled on reproduction as she ages as well as the need to improve on her blood pressure control before becoming pregnant again.  Expect annual exam in 3-4 months. I spent 15 minutes dedicated to the care of this patient including previsit review of  records, face to face time with the patient discussing etiology of disease, treatment plan and post visit testing.   Griffin Basil, MD Faculty Attending, [facility]

## 2021-04-24 ENCOUNTER — Other Ambulatory Visit: Payer: Self-pay

## 2021-04-24 ENCOUNTER — Other Ambulatory Visit (HOSPITAL_COMMUNITY)
Admission: RE | Admit: 2021-04-24 | Payer: BLUE CROSS/BLUE SHIELD | Source: Ambulatory Visit | Admitting: Obstetrics and Gynecology

## 2021-04-24 ENCOUNTER — Ambulatory Visit (INDEPENDENT_AMBULATORY_CARE_PROVIDER_SITE_OTHER): Payer: BLUE CROSS/BLUE SHIELD | Admitting: Obstetrics and Gynecology

## 2021-04-24 ENCOUNTER — Encounter: Payer: Self-pay | Admitting: Obstetrics and Gynecology

## 2021-04-24 VITALS — BP 144/84 | HR 64 | Wt 210.6 lb

## 2021-04-24 DIAGNOSIS — D251 Intramural leiomyoma of uterus: Secondary | ICD-10-CM | POA: Diagnosis not present

## 2021-04-24 DIAGNOSIS — Z01419 Encounter for gynecological examination (general) (routine) without abnormal findings: Secondary | ICD-10-CM

## 2021-04-24 DIAGNOSIS — Z113 Encounter for screening for infections with a predominantly sexual mode of transmission: Secondary | ICD-10-CM

## 2021-04-24 NOTE — Progress Notes (Signed)
GYNECOLOGY ANNUAL PREVENTATIVE CARE ENCOUNTER NOTE  History:     Bailey Thomas is a 30 y.o. [phone] female here for a routine annual gynecologic exam.  Current complaints: occasional midline abdominal pain.   Denies abnormal vaginal bleeding, discharge, problems with intercourse or other gynecologic concerns.  Pt does note intermittent periumbilical sharp abdominal pain.  She thinks there may be some association with food.  Pt notes daily defecation, but no bloody stools.  Pt notes some frequent urination and nocturia x 3.     Gynecologic History Patient's last menstrual period was 04/18/2021. Contraception: OCP (estrogen/progesterone) Last Pap: 2018. Results were: normal with negative HPV Last mammogram:n/a Obstetric History OB History  Gravida Para Term Preterm AB Living  '5 1 1 '$ 0 4 1  SAB IAB Ectopic Multiple Live Births  0 4 0 0 1    # Outcome Date GA Lbr Len/2nd Weight Sex Delivery Anes PTL Lv  5 Term 2015     CS-Unspec   LIV  4 IAB           3 IAB           2 IAB           1 IAB             Past Medical History:  Diagnosis Date   Hypertension    Obesity     Past Surgical History:  Procedure Laterality Date   APPENDECTOMY     CESAREAN SECTION     GYNECOLOGIC CRYOSURGERY     INDUCED ABORTION      Current Outpatient Medications on File Prior to Visit  Medication Sig Dispense Refill   amLODipine (NORVASC) 10 MG tablet Take by mouth.     atorvastatin (LIPITOR) 10 MG tablet Take by mouth.     cephALEXin (KEFLEX) 500 MG capsule Take 1 capsule (500 mg total) by mouth 4 (four) times daily. 20 capsule 0   escitalopram (LEXAPRO) 10 MG tablet Take 10 mg by mouth daily.     losartan-hydrochlorothiazide (HYZAAR) 50-12.5 MG tablet Take by mouth.     norgestimate-ethinyl estradiol (ORTHO-CYCLEN,SPRINTEC,PREVIFEM) 0.25-35 MG-MCG tablet Take 1 tablet by mouth daily. 1 Package 11   ondansetron (ZOFRAN) 4 MG tablet Take 1 tablet (4 mg total) by mouth every 6 (six) hours. 12 tablet  0   UNKNOWN TO PATIENT Takes 2 meds for HTN and 1 for cholesterol that she does not know what they are. (Patient not taking: Reported on 04/24/2021)     No current facility-administered medications on file prior to visit.    No Known Allergies  Social History:  reports that she has never smoked. She has never used smokeless tobacco. She reports current alcohol use. She reports current drug use. Drug: Marijuana.  Family History  Problem Relation Age of Onset   Hypertension Mother    Diabetes Mother    Cancer Neg Hx    Stroke Neg Hx     The following portions of the patient's history were reviewed and updated as appropriate: allergies, current medications, past family history, past medical history, past social history, past surgical history and problem list.  Review of Systems Pertinent items noted in HPI and remainder of comprehensive ROS otherwise negative.  Physical Exam:  BP (!) 144/84   Pulse 64   Wt 210 lb 9.6 oz (95.5 kg)   LMP 04/18/2021   BMI 33.99 kg/m  CONSTITUTIONAL: Well-developed, well-nourished female in no acute distress.  HENT:  Normocephalic, atraumatic, External right  and left ear normal. Oropharynx is clear and moist EYES: Conjunctivae and EOM are normal.  NECK: Normal range of motion, supple, no masses.  Normal thyroid.  SKIN: Skin is warm and dry. No rash noted. Not diaphoretic. No erythema. No pallor. MUSCULOSKELETAL: Normal range of motion. No tenderness.  No cyanosis, clubbing, or edema.  2+ distal pulses. NEUROLOGIC: Alert and oriented to person, place, and time. Normal reflexes, muscle tone coordination.  PSYCHIATRIC: Normal mood and affect. Normal behavior. Normal judgment and thought content. CARDIOVASCULAR: Normal heart rate noted, regular rhythm RESPIRATORY: Clear to auscultation bilaterally. Effort and breath sounds normal, no problems with respiration noted. BREASTS: deferred ABDOMEN: Soft, no distention noted.  No tenderness, rebound or guarding.   PELVIC: Normal appearing external genitalia and urethral meatus; normal appearing vaginal mucosa and cervix.  No abnormal discharge noted.  Pap smear obtained.  Normal uterine size, no other palpable masses, no uterine or adnexal tenderness.  Performed in the presence of a chaperone.   Assessment and Plan:    1. Routine screening for STI (sexually transmitted infection) Not completed per pt request  2. Intramural leiomyoma of uterus Reviewed last u/s 08/2020 with 2 small fibroids., both intramural  3. Women's annual routine gynecological examination Normal annual exam, pap pending - Cytology - PAP  Will follow up results of pap smear and manage accordingly. Routine preventative health maintenance measures emphasized. Please refer to After Visit Summary for other counseling recommendations.   F/u in 1 year or PRN, if abdominal pain continues, consider GI referral     Lynnda Shields, MD, [place], [facility] for [facility], [place]

## 2021-05-02 LAB — CYTOLOGY - PAP
Comment: NEGATIVE
Diagnosis: NEGATIVE
High risk HPV: NEGATIVE

## 2021-07-30 ENCOUNTER — Encounter (HOSPITAL_BASED_OUTPATIENT_CLINIC_OR_DEPARTMENT_OTHER): Payer: Self-pay

## 2021-07-30 ENCOUNTER — Emergency Department (HOSPITAL_BASED_OUTPATIENT_CLINIC_OR_DEPARTMENT_OTHER)
Admission: EM | Admit: 2021-07-30 | Discharge: 2021-07-30 | Disposition: A | Discharge status: Home / Self Care | Payer: BLUE CROSS/BLUE SHIELD | Attending: Emergency Medicine | Admitting: Emergency Medicine

## 2021-07-30 ENCOUNTER — Other Ambulatory Visit: Payer: Self-pay

## 2021-07-30 DIAGNOSIS — I10 Essential (primary) hypertension: Secondary | ICD-10-CM | POA: Insufficient documentation

## 2021-07-30 DIAGNOSIS — Z79899 Other long term (current) drug therapy: Secondary | ICD-10-CM | POA: Insufficient documentation

## 2021-07-30 DIAGNOSIS — J069 Acute upper respiratory infection, unspecified: Secondary | ICD-10-CM | POA: Insufficient documentation

## 2021-07-30 DIAGNOSIS — K0889 Other specified disorders of teeth and supporting structures: Secondary | ICD-10-CM | POA: Insufficient documentation

## 2021-07-30 DIAGNOSIS — Z20822 Contact with and (suspected) exposure to covid-19: Secondary | ICD-10-CM | POA: Insufficient documentation

## 2021-07-30 LAB — RESP PANEL BY RT-PCR (FLU A&B, COVID) ARPGX2
Influenza A by PCR: NEGATIVE
Influenza B by PCR: NEGATIVE
SARS Coronavirus 2 by RT PCR: NEGATIVE

## 2021-07-30 MED ORDER — PENICILLIN V POTASSIUM 500 MG PO TABS: 500.0000 mg | ORAL_TABLET | Freq: Four times a day (QID) | ORAL | 0 refills | Status: AC

## 2021-07-30 NOTE — ED Provider Notes (Signed)
[facility] EMERGENCY DEPARTMENT Provider Note   CSN: [id number] Arrival date & time: 07/30/21  1191     History Chief Complaint  Patient presents with   Dental Pain    Bailey Thomas is a 30 y.o. female.  She has a history of hypertension.  Complaining of 2 weeks of dental pain right upper and lower molar.  This is bothered her before.  She is also complaining of a head cold for 1 week.  This is runny nose postnasal drip and a little bit of a cough.  Denies any fevers.  No facial swelling.  Has tried nothing for it.  Does not have a dentist.  She is COVID vaccinated not flu vaccinated  The history is provided by the patient.  Dental Pain Location:  Upper and lower Upper teeth location:  2/RU 2nd molar Lower teeth location:  31/RL 2nd molar Quality:  Aching Severity:  Moderate Onset quality:  Gradual Duration:  2 weeks Timing:  Constant Progression:  Unchanged Chronicity:  Recurrent Context: normal dentition and not trauma   Relieved by:  Nothing Worsened by:  Nothing Ineffective treatments:  None tried Associated symptoms: no facial swelling, no fever, no headaches, no neck pain, no oral bleeding and no trismus   Risk factors: lack of dental care       Past Medical History:  Diagnosis Date   Hypertension    Obesity     Patient Active Problem List   Diagnosis Date Noted   Women's annual routine gynecological examination 04/24/2021   Irregular menses 11/15/2020   Fibroid uterus 11/15/2020   History of cryosurgery 06/22/2017    Past Surgical History:  Procedure Laterality Date   ABDOMINAL SURGERY     APPENDECTOMY     CESAREAN SECTION     GYNECOLOGIC CRYOSURGERY     INDUCED ABORTION       OB History     Gravida  5   Para  1   Term  1   Preterm  0   AB  4   Living  1      SAB  0   IAB  4   Ectopic  0   Multiple  0   Live Births  1           Family History  Problem Relation Age of Onset   Hypertension Mother    Diabetes  Mother    Cancer Neg Hx    Stroke Neg Hx     Social History   Tobacco Use   Smoking status: Never   Smokeless tobacco: Never  Vaping Use   Vaping Use: Never used  Substance Use Topics   Alcohol use: Yes    Comment: occ   Drug use: Yes    Types: Marijuana    Home Medications Prior to Admission medications   Medication Sig Start Date End Date Taking? Authorizing Provider  amLODipine (NORVASC) 10 MG tablet Take by mouth. 11/03/19   [provider]  atorvastatin (LIPITOR) 10 MG tablet Take by mouth. 11/03/19   [provider]  cephALEXin (KEFLEX) 500 MG capsule Take 1 capsule (500 mg total) by mouth 4 (four) times daily. 09/21/20   Wende Mott, CNM  escitalopram (LEXAPRO) 10 MG tablet Take 10 mg by mouth daily. 04/09/21   [provider]  losartan-hydrochlorothiazide (HYZAAR) 50-12.5 MG tablet Take by mouth. 11/03/19   [provider]  norgestimate-ethinyl estradiol (ORTHO-CYCLEN,SPRINTEC,PREVIFEM) 0.25-35 MG-MCG tablet Take 1 tablet by mouth daily. 09/04/17  Everrett Coombe, MD  ondansetron (ZOFRAN) 4 MG tablet Take 1 tablet (4 mg total) by mouth every 6 (six) hours. 06/14/19   Lucrezia Starch, MD  UNKNOWN TO PATIENT Takes 2 meds for HTN and 1 for cholesterol that she does not know what they are. Patient not taking: Reported on 04/24/2021    [provider]    Allergies    Patient has no known allergies.  Review of Systems   Review of Systems  Constitutional:  Negative for fever.  HENT:  Positive for postnasal drip, rhinorrhea and sneezing. Negative for facial swelling.   Eyes:  Negative for visual disturbance.  Respiratory:  Positive for cough.   Cardiovascular:  Negative for chest pain.  Gastrointestinal:  Negative for abdominal pain.  Musculoskeletal:  Negative for neck pain.  Neurological:  Negative for headaches.   Physical Exam Updated Vital Signs BP (S) (!) 174/109 (BP Location: Right Arm) Comment: pt reports taking her  BP medication PTA  Pulse 78   Temp 98.8 F (37.1 C)   Resp 16   Ht 5\' 6"  (1.676 m)   Wt 95.3 kg   LMP 07/03/2021   SpO2 100%   BMI 33.89 kg/m   Physical Exam Constitutional:      Appearance: Normal appearance. She is well-developed.  HENT:     Head: Normocephalic and atraumatic.     Right Ear: Tympanic membrane normal.     Left Ear: Tympanic membrane normal.     Nose: Nose normal.     Mouth/Throat:     Mouth: Mucous membranes are moist.     Pharynx: Oropharynx is clear. No oropharyngeal exudate or posterior oropharyngeal erythema.  Eyes:     Conjunctiva/sclera: Conjunctivae normal.  Cardiovascular:     Rate and Rhythm: Normal rate and regular rhythm.  Pulmonary:     Effort: Pulmonary effort is normal.     Breath sounds: Normal breath sounds.  Musculoskeletal:     Cervical back: Normal range of motion and neck supple.  Skin:    General: Skin is warm and dry.  Neurological:     General: No focal deficit present.     Mental Status: She is alert.     GCS: GCS eye subscore is 4. GCS verbal subscore is 5. GCS motor subscore is 6.    ED Results / Procedures / Treatments   Labs (all labs ordered are listed, but only abnormal results are displayed) Labs Reviewed  RESP PANEL BY RT-PCR (FLU A&B, COVID) ARPGX2    EKG None  Radiology No results found.  Procedures Procedures   Medications Ordered in ED Medications - No data to display  ED Course  I have reviewed the triage vital signs and the nursing notes.  Pertinent labs & imaging results that were available during my care of the patient were reviewed by me and considered in my medical decision making (see chart for details).    MDM Rules/Calculators/A&P                          [name] was evaluated in Emergency Department on 07/30/2021 for the symptoms described in the history of present illness. She was evaluated in the context of the global COVID-19 pandemic, which necessitated consideration that the  patient might be at risk for infection with the SARS-CoV-2 virus that causes COVID-19. Institutional protocols and algorithms that pertain to the evaluation of patients at risk for COVID-19 are in a state of  rapid change based on information released by regulatory bodies including the CDC and federal and state organizations. These policies and algorithms were followed during the patient's care in the ED. Patient here with ongoing dental pain on and off for months.  Also with 1 week of nasal congestion head cold symptoms.  COVID and flu sent and pending at time of discharge.  No evidence of trismus or dental abscess requiring imaging or further testing.  Will cover with antibiotics.  Given dental contact information.  Return instructions discussed  Final Clinical Impression(s) / ED Diagnoses Final diagnoses:  Pain, dental  Upper respiratory tract infection, unspecified type  Person under investigation for COVID-19  Primary hypertension    Rx / DC Orders ED Discharge Orders          Ordered    penicillin v potassium (VEETID) 500 MG tablet  4 times daily        07/30/21 0801             Hayden Rasmussen, MD 07/30/21 [id number]

## 2021-07-30 NOTE — Discharge Instructions (Addendum)
You are seen in the emergency department for dental pain and also symptoms of upper respiratory infection.  You had a COVID and flu test that is pending at time of discharge and you can follow this up in [place].  We are prescribing you an antibiotic for your dental pain.  Please contact Dr. Haig Prophet for dental follow-up.  Your blood pressure was also elevated and this needs follow-up with a primary care doctor.  Return to the emergency department if any worsening or concerning symptoms

## 2021-07-30 NOTE — ED Triage Notes (Signed)
Pt arrives ambulatory to ED with c/o dental pain on right side, top and bottom. Pt reports pain has been ongoing for a few months, treating at home with tylenol. Also reports head cold X1 week.

## 2021-08-02 ENCOUNTER — Emergency Department (HOSPITAL_BASED_OUTPATIENT_CLINIC_OR_DEPARTMENT_OTHER)
Admission: EM | Admit: 2021-08-02 | Discharge: 2021-08-02 | Disposition: A | Discharge status: Home / Self Care | Payer: BLUE CROSS/BLUE SHIELD | Attending: Emergency Medicine | Admitting: Emergency Medicine

## 2021-08-02 ENCOUNTER — Encounter (HOSPITAL_BASED_OUTPATIENT_CLINIC_OR_DEPARTMENT_OTHER): Payer: Self-pay | Admitting: *Deleted

## 2021-08-02 ENCOUNTER — Other Ambulatory Visit: Payer: Self-pay

## 2021-08-02 DIAGNOSIS — K0889 Other specified disorders of teeth and supporting structures: Secondary | ICD-10-CM | POA: Diagnosis present

## 2021-08-02 DIAGNOSIS — I10 Essential (primary) hypertension: Secondary | ICD-10-CM | POA: Diagnosis not present

## 2021-08-02 MED ORDER — SENNOSIDES-DOCUSATE SODIUM 8.6-50 MG PO TABS: 1.0000 | ORAL_TABLET | Freq: Every evening | ORAL | 0 refills | Status: AC | PRN

## 2021-08-02 MED ORDER — OXYCODONE-ACETAMINOPHEN 5-325 MG PO TABS: 1.0000 | ORAL_TABLET | Freq: Four times a day (QID) | ORAL | 0 refills | Status: AC | PRN

## 2021-08-02 MED ORDER — CLINDAMYCIN HCL 300 MG PO CAPS: 300.0000 mg | ORAL_CAPSULE | Freq: Three times a day (TID) | ORAL | 0 refills | Status: AC

## 2021-08-02 NOTE — ED Provider Notes (Signed)
Emergency Department Provider Note   I have reviewed the triage vital signs and the nursing notes.   HISTORY  Chief Complaint Dental Pain   HPI Bailey Thomas is a 30 y.o. female with past medical history reviewed below returns emergency department with left upper dental pain.  She was seen 3 days ago in the emergency department and sent home with penicillin.  She is been taking ibuprofen along with Tylenol but notes that swelling and pain of worsened.  She not having any difficulty speaking or swallowing.  No shortness of breath.  No drainage or bleeding from the mouth.  She is called multiple dental practices but cannot get an appointment for several weeks.  She states with worsening pain she returns to the emergency department for reevaluation. Pain is moderate to severe and worse with touching.    Past Medical History:  Diagnosis Date   Hypertension    Obesity     Patient Active Problem List   Diagnosis Date Noted   Women's annual routine gynecological examination 04/24/2021   Irregular menses 11/15/2020   Fibroid uterus 11/15/2020   History of cryosurgery 06/22/2017    Past Surgical History:  Procedure Laterality Date   ABDOMINAL SURGERY     APPENDECTOMY     CESAREAN SECTION     GYNECOLOGIC CRYOSURGERY     INDUCED ABORTION      Allergies Patient has no known allergies.  Family History  Problem Relation Age of Onset   Hypertension Mother    Diabetes Mother    Cancer Neg Hx    Stroke Neg Hx     Social History Social History   Tobacco Use   Smoking status: Never   Smokeless tobacco: Never  Vaping Use   Vaping Use: Never used  Substance Use Topics   Alcohol use: Yes    Comment: occ   Drug use: Yes    Types: Marijuana    Review of Systems  Constitutional: No fever/chills Eyes: No visual changes. ENT: No sore throat. Positive left dental pain.  Cardiovascular: Denies chest pain. Respiratory: Denies shortness of breath. Gastrointestinal: No  abdominal pain.  Neurological: Negative for headaches   ____________________________________________   PHYSICAL EXAM:  VITAL SIGNS: ED Triage Vitals  Enc Vitals Group     BP 08/02/21 1447 (!) 136/91     Pulse Rate 08/02/21 1447 78     Resp 08/02/21 1447 18     Temp 08/02/21 1446 98 F (36.7 C)     Temp Source 08/02/21 1446 Oral     SpO2 08/02/21 1447 100 %     Weight 08/02/21 1442 209 lb 14.1 oz (95.2 kg)     Height 08/02/21 1442 5\' 6"  (1.676 m)    Constitutional: Alert and oriented. Well appearing and in no acute distress. Eyes: Conjunctivae are normal.  Head: Atraumatic. Nose: No congestion/rhinnorhea. Mouth/Throat: Mucous membranes are moist.  Oropharynx non-erythematous. No PTA. No tonsillar exudate.  No dental fracture or visible abscess along the gingiva.  No trismus.  Oropharynx is widely patent and submandibular compartment is soft.  Neck: No stridor.   Cardiovascular: Normal rate, regular rhythm.  Respiratory: Normal respiratory effort.  Gastrointestinal: No distention.  Musculoskeletal: No gross deformities of extremities. Neurologic:  Normal speech and language.  Skin:  Skin is warm, dry and intact. No rash noted.  ____________________________________________   PROCEDURES  Procedure(s) performed:   Procedures  None  ____________________________________________   INITIAL IMPRESSION / ASSESSMENT AND PLAN / ED COURSE  Pertinent  labs & imaging results that were available during my care of the patient were reviewed by me and considered in my medical decision making (see chart for details).   Patient presents to the emergency department for evaluation of worsening dental pain.  I do not appreciate any trismus or hard signs to suspect deeper space neck or face infection, [place]'s angina, peritonsillar abscess, or other acute/surgical process.  She has been compliant with her penicillin but not improving.  She is tried reaching out to dental practices and does  not have a close appointment.  She has been compliant with antibiotics for the past 3 days with worsening symptoms.  I do think in this setting it is reasonable to consider changing her antibiotics to clindamycin.  Have called in a short prescription for Percocet although discussed risk/benefit of this medication.  Reviewed the [facility] drug database prior to prescribing.  Discussed constipation side effects, dependency, inability to drive while taking this medicine.  Patient will continue to call dental practices to try and get follow-up.  Discussed ED return precautions.   ____________________________________________  FINAL CLINICAL IMPRESSION(S) / ED DIAGNOSES  Final diagnoses:  Pain, dental     NEW OUTPATIENT MEDICATIONS STARTED DURING THIS VISIT:  Discharge Medication List as of 08/02/2021  3:30 PM     START taking these medications   Details  clindamycin (CLEOCIN) 300 MG capsule Take 1 capsule (300 mg total) by mouth 3 (three) times daily for 7 days., Starting Sat 08/02/2021, Until Sat 08/09/2021, Normal    oxyCODONE-acetaminophen (PERCOCET/ROXICET) 5-325 MG tablet Take 1 tablet by mouth every 6 (six) hours as needed for severe pain., Starting Sat 08/02/2021, Normal    senna-docusate (SENOKOT-S) 8.6-50 MG tablet Take 1 tablet by mouth at bedtime as needed for mild constipation., Starting Sat 08/02/2021, Normal        Note:  This document was prepared using Dragon voice recognition software and may include unintentional dictation errors.  Nanda Quinton, MD, [facility]    Tyauna Lacaze, [facility], MD 08/02/21 [phone]

## 2021-08-02 NOTE — ED Triage Notes (Signed)
Pt seen here earlier this week for same c/o pain right side of face. She was given antibiotics on Wednesday but the pain is not any better

## 2021-08-02 NOTE — Discharge Instructions (Signed)
Please follow closely with a dentist as soon as you are able. Do not take Percocet if you will be driving or if drinking alcohol.

## 2021-08-08 IMAGING — US US OB TRANSVAGINAL
1 series · 14 of 28 positions shown · non-contrast
Comparison: None.

CLINICAL DATA: Bleeding, cramps

EXAM:
TRANSVAGINAL OB ULTRASOUND
TECHNIQUE: Transvaginal ultrasound was performed for complete evaluation of the
gestation as well as the maternal uterus, adnexal regions, and
pelvic cul-de-sac.

[Series 1: us ob transvaginal · 14 of 98 slices shown]
[im 4/98]
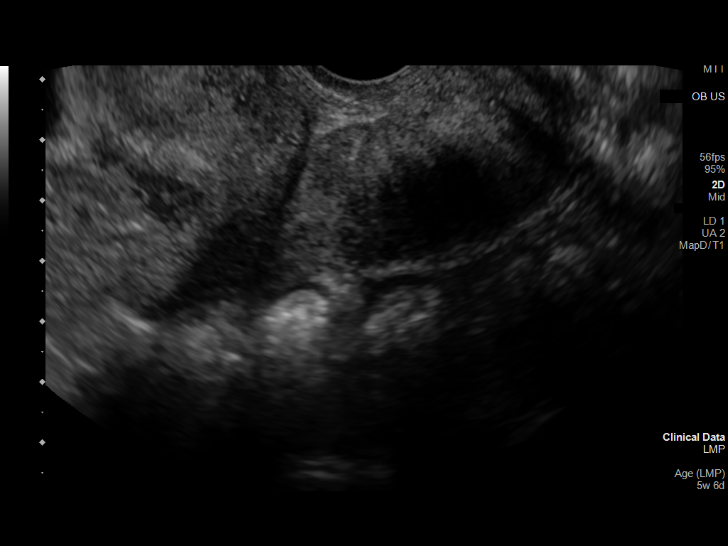
[im 11/98]
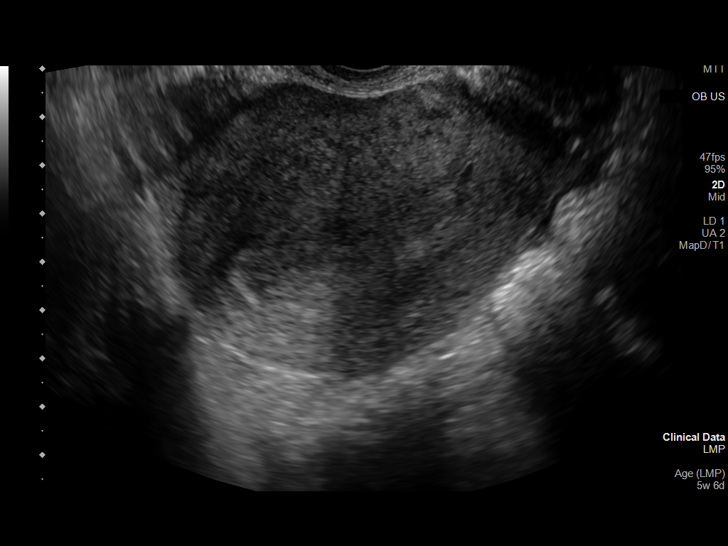
[im 18/98]
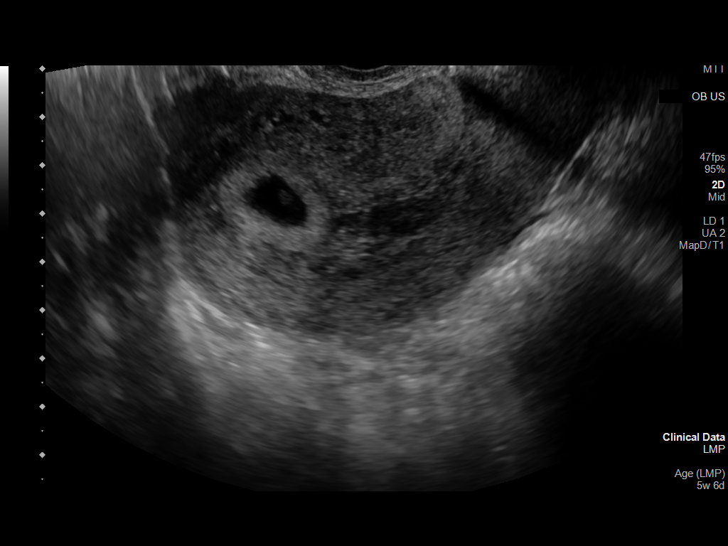
[im 26/98]
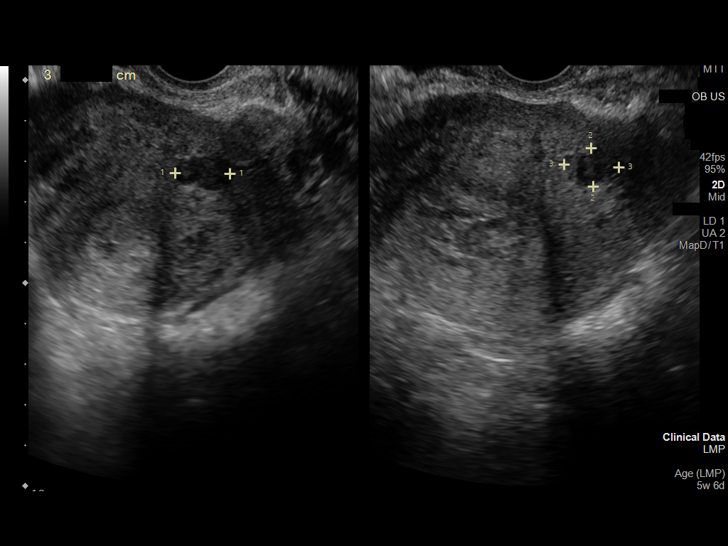
[im 33/98]
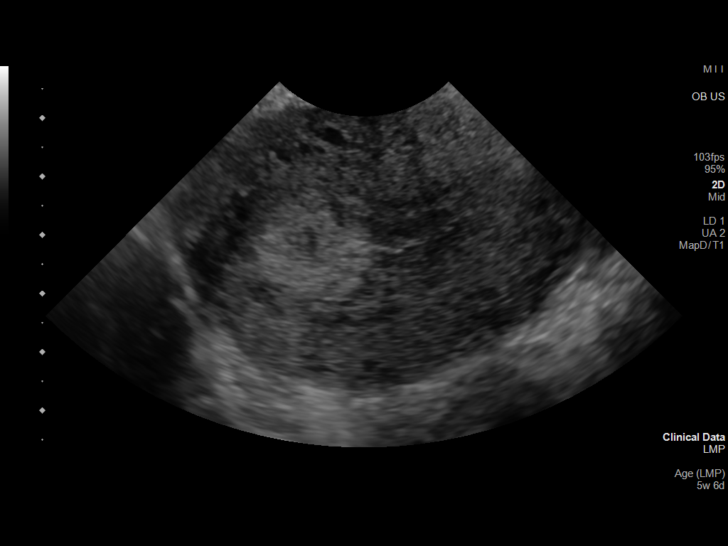
[im 40/98]
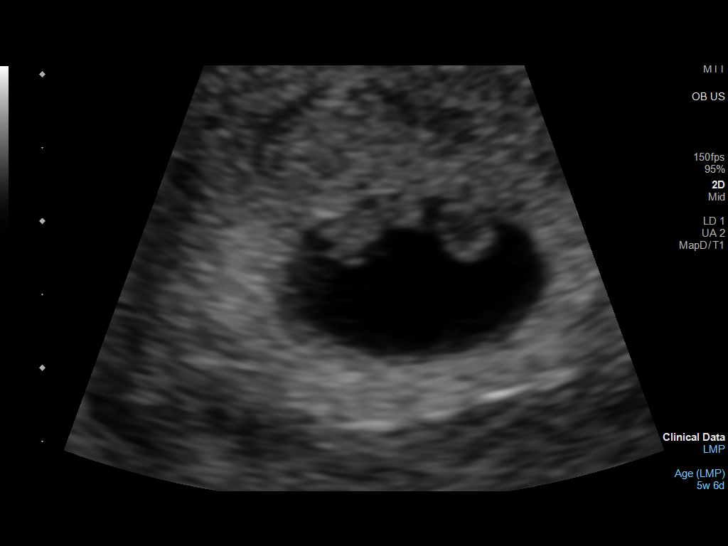
[im 47/98]
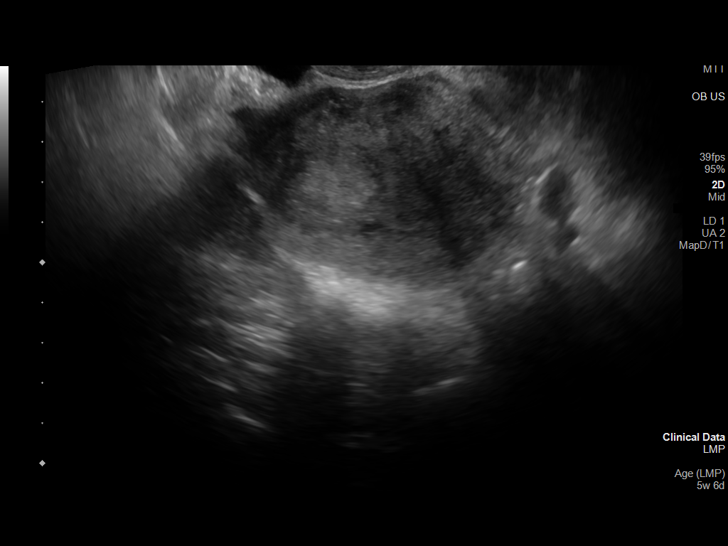
[im 54/98]
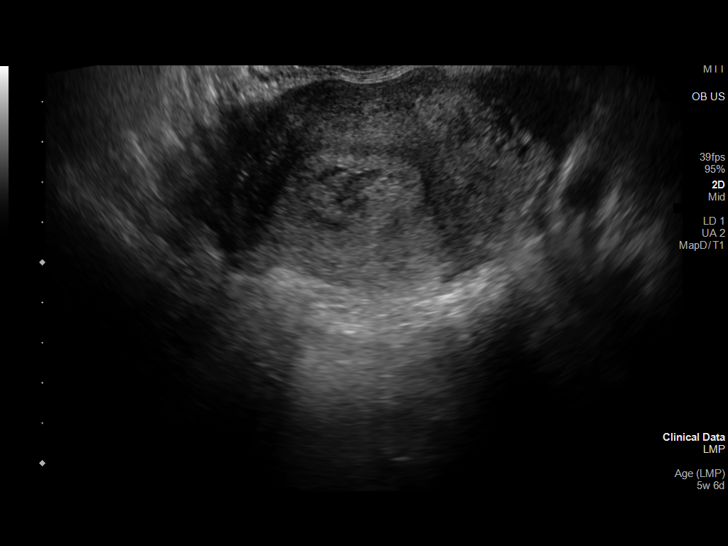
[im 62/98]
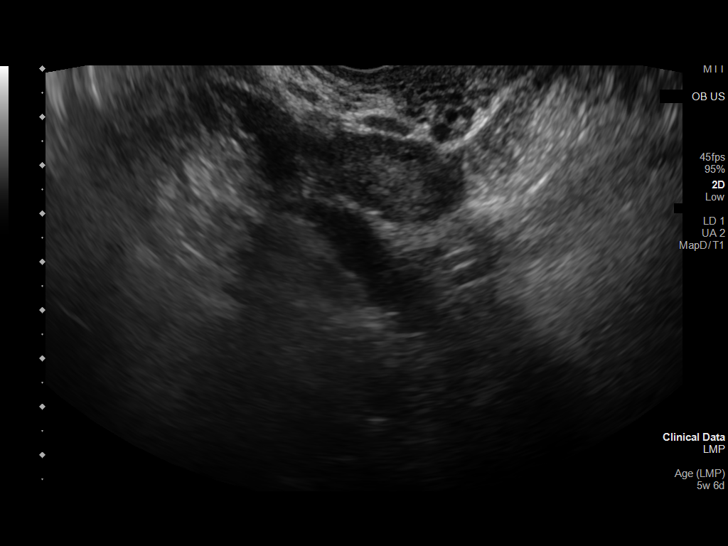
[im 69/98]
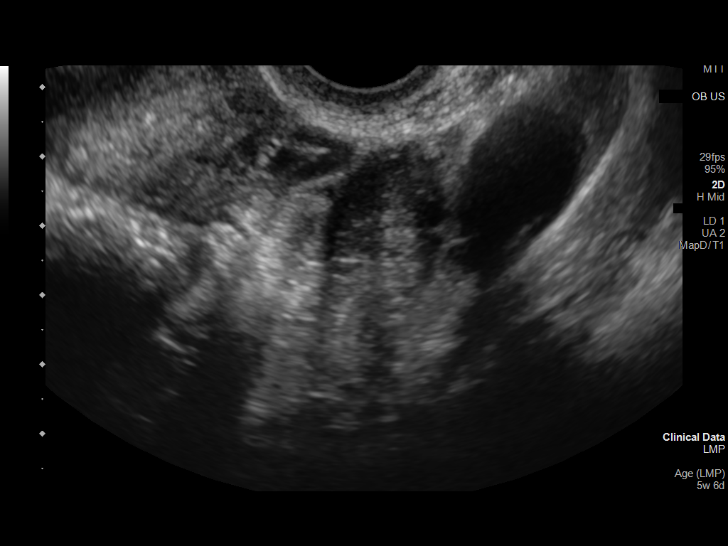
[im 76/98]
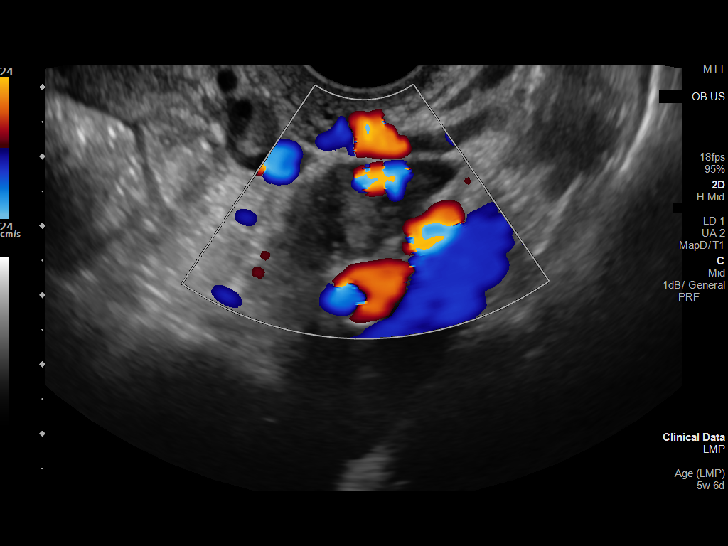
[im 83/98]
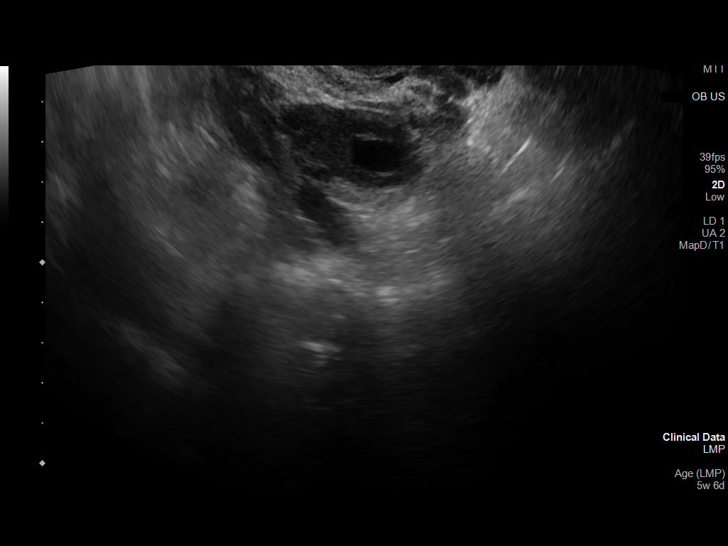
[im 90/98]
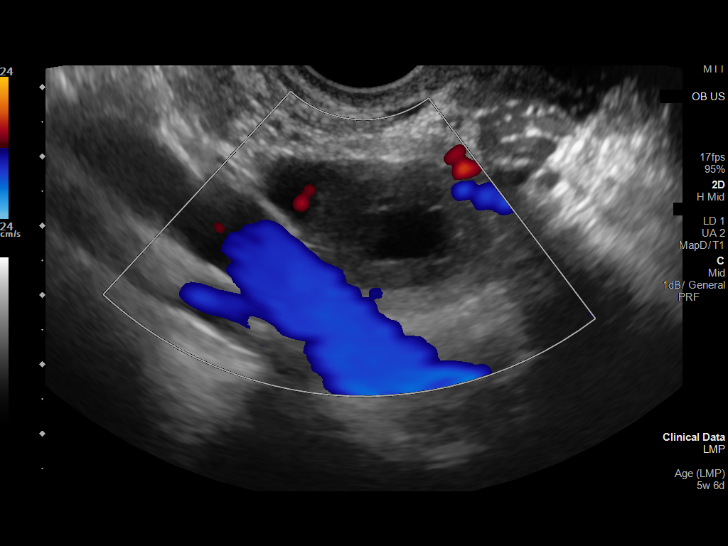
[im 98/98]
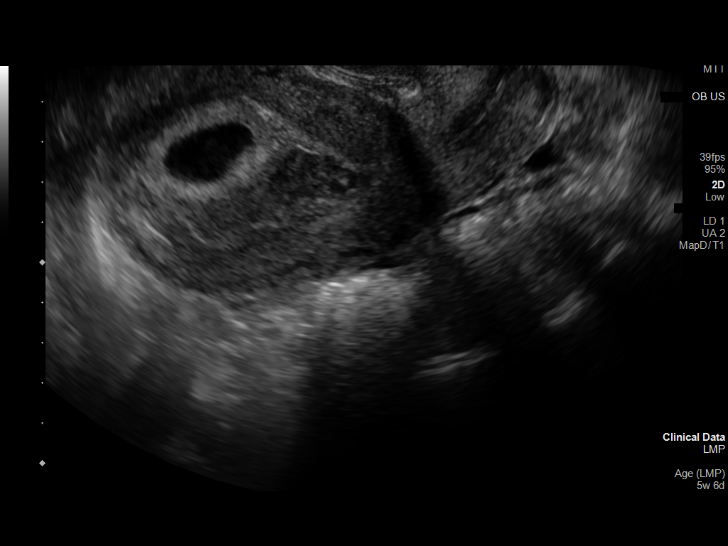

[14 of 28 positions shown; findings below may reference images not displayed]

FINDINGS: Intrauterine gestational sac: Single

Yolk sac:  Visualized

Embryo:  Visualized

Cardiac Activity: Visualized

Heart Rate: 120 bpm

MSD: 17.4 mm   6 w   4 d

CRL:   7.2 mm   6 w for d                  US EDC: 02/03/2020

Subchorionic hemorrhage: Small-moderate sized subchorionic
hemorrhage measuring approximately 12 mm.

Maternal uterus/adnexae: Right ovarian hypoechoic mass consistent
with a corpus luteum cyst. Multiple hypoechoic small uterine masses
with the largest measuring 1.7 x 1.5 x 1.5 cm in the left side of
the fundus consistent with small fibroids. No pelvic free fluid.
IMPRESSION: 1. Single live intrauterine pregnancy as detailed above.
2. Small-moderate sized subchorionic hemorrhage.

## 2022-11-16 IMAGING — US US OB < 14 WEEKS - US OB TV
1 series · 15 of 28 positions shown · non-contrast
Comparison: None

CLINICAL DATA: First trimester pregnancy, bleeding, unknown dates;
no quantitative beta HCG for correlation; had unprotected sex on
08/12/2020, took Plan B afterwards; history Caesarean section

EXAM:
OBSTETRIC <14 WK US AND TRANSVAGINAL OB US
TECHNIQUE: Both transabdominal and transvaginal ultrasound examinations were
performed for complete evaluation of the gestation as well as the
maternal uterus, adnexal regions, and pelvic cul-de-sac.
Transvaginal technique was performed to assess early pregnancy.

[Series 1: us ob < 14 weeks - us ob tv · 15 of 69 slices shown]
[im 1/69]
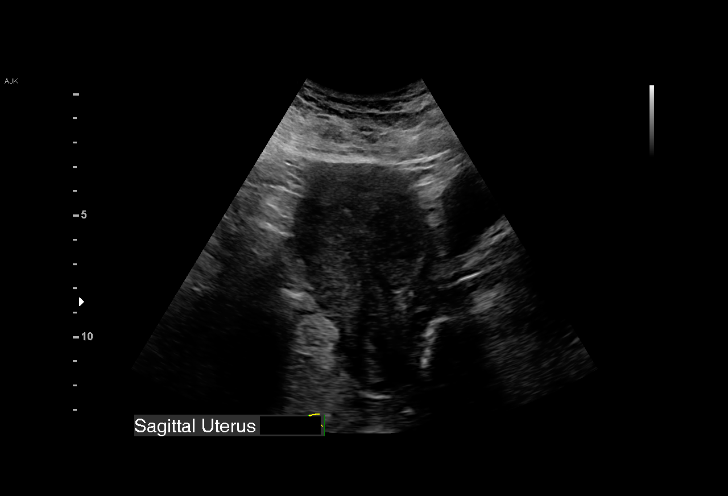
[im 6/69]
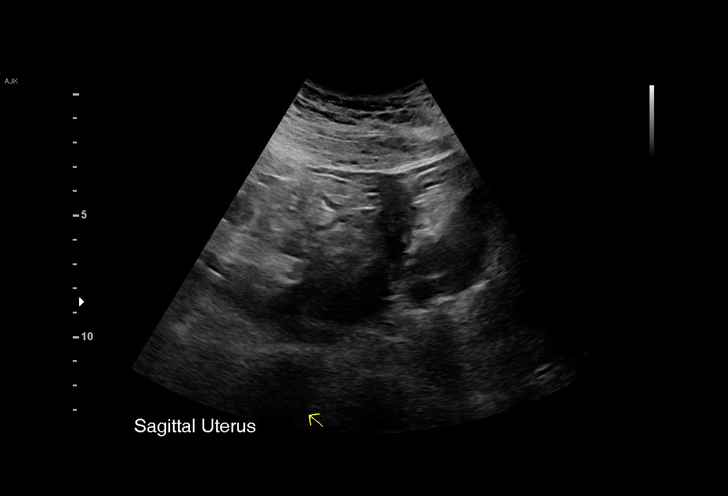
[im 11/69]
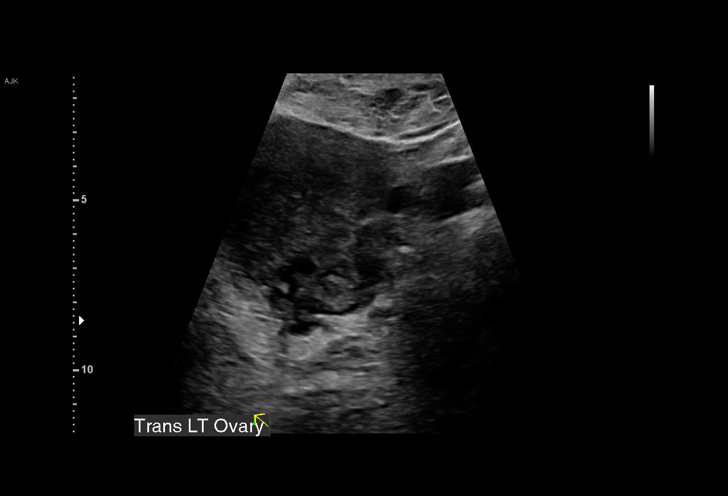
[im 16/69]
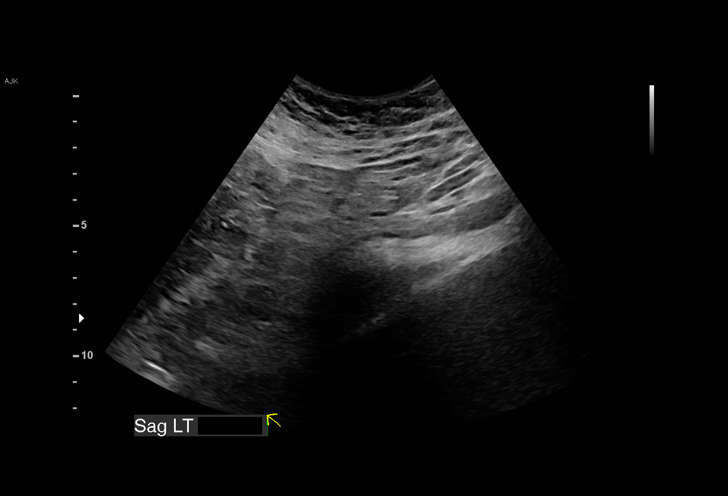
[im 21/69]
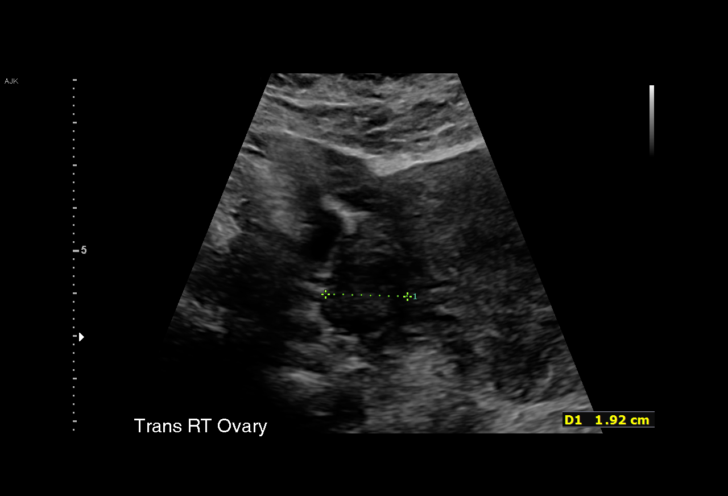
[im 26/69]
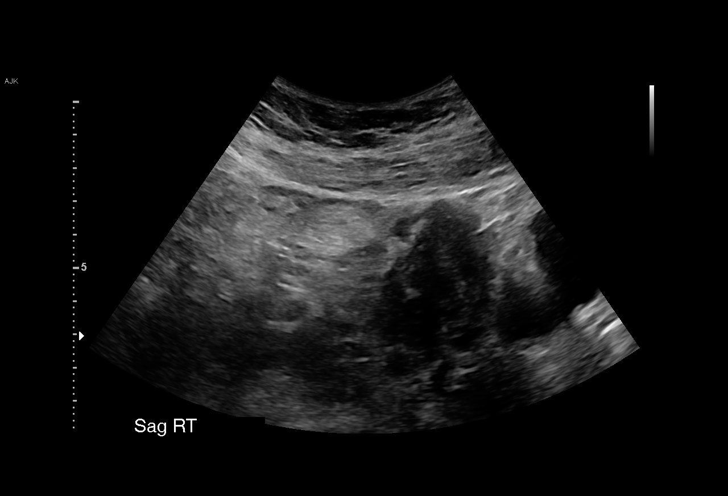
[im 31/69]
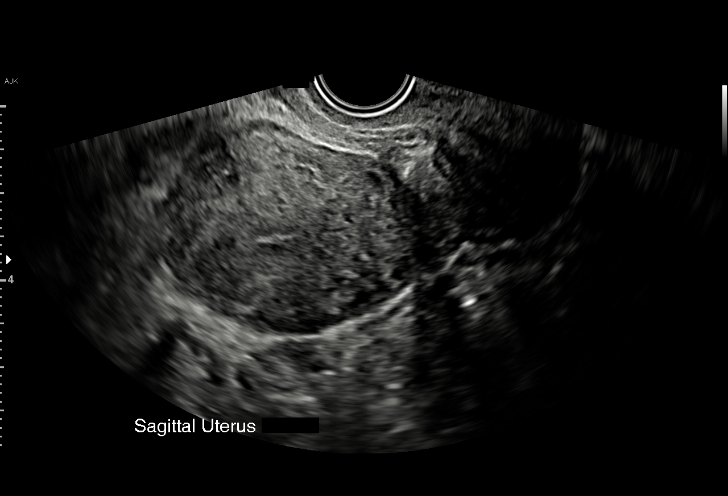
[im 36/69]
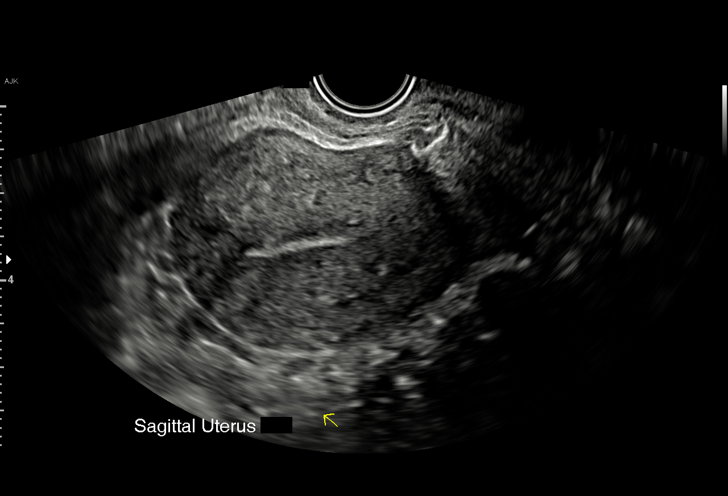
[im 38/69]
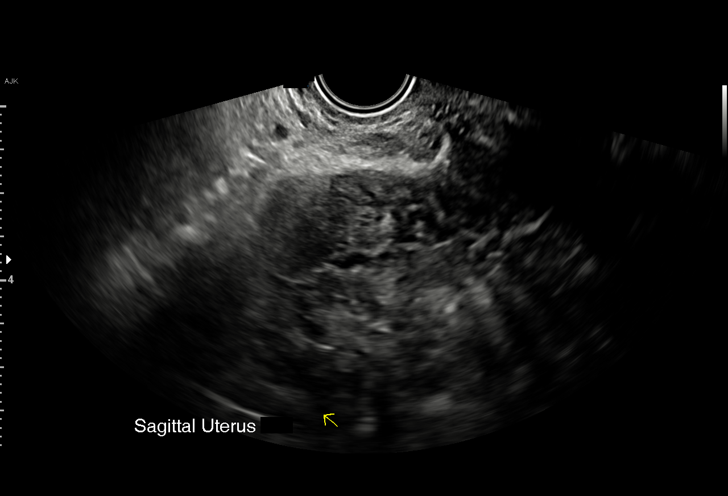
[im 43/69]
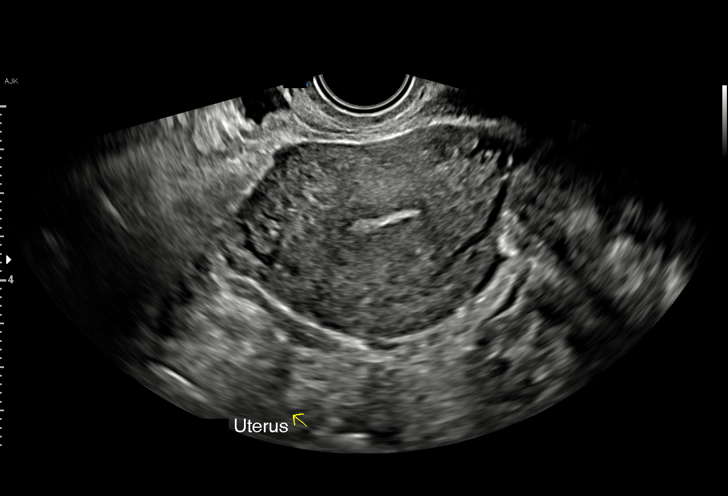
[im 48/69]
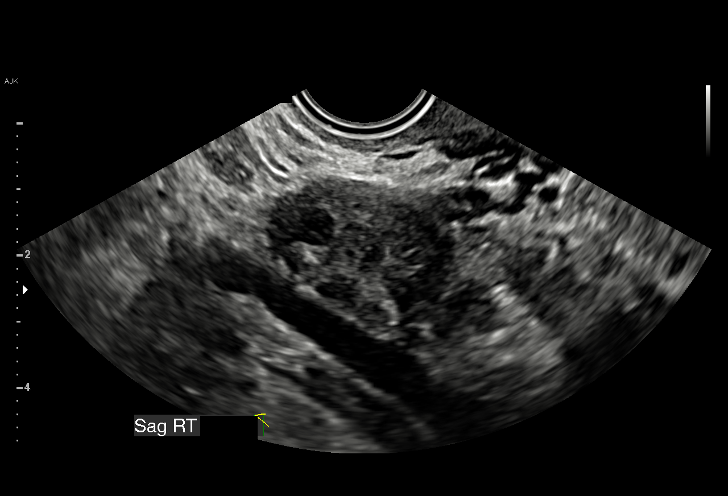
[im 53/69]
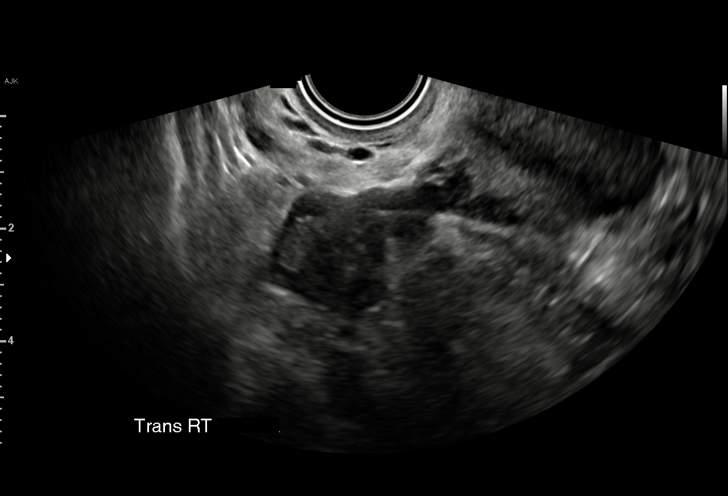
[im 58/69]
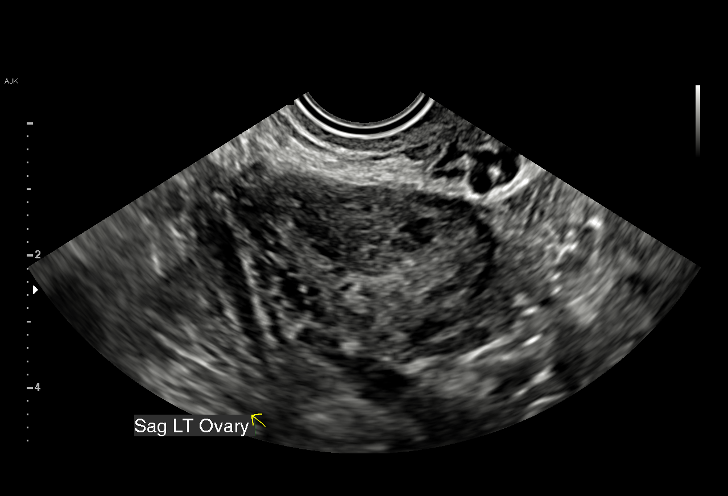
[im 63/69]
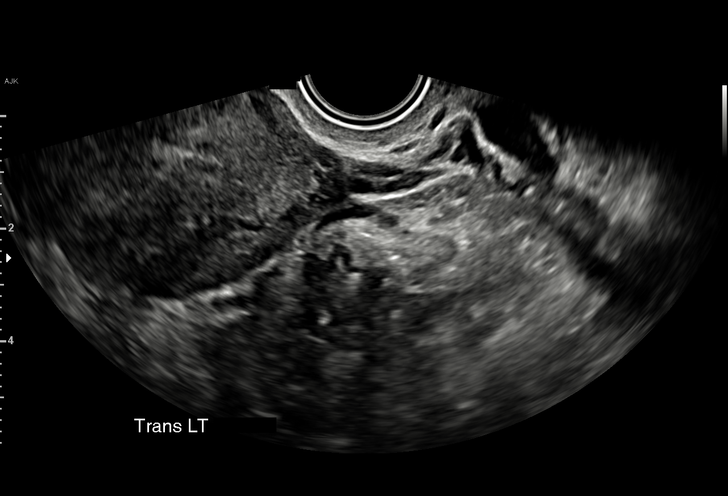
[im 69/69]
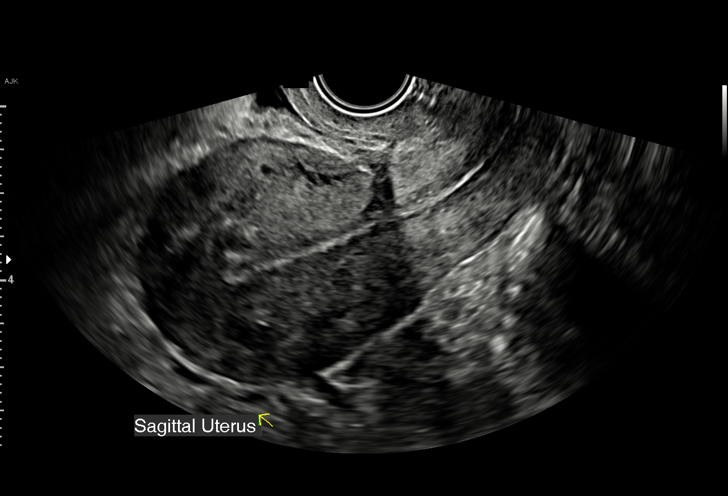

[15 of 28 positions shown; findings below may reference images not displayed]

FINDINGS: Intrauterine gestational sac: Absent

Yolk sac:  N/A

Embryo:  N/A

Cardiac Activity: N/A

Heart Rate: N/A  bpm

MSD:   mm    w     d

CRL:    mm    w    d                  US EDC:

Subchorionic hemorrhage:  N/A

Maternal uterus/adnexae:

Uterus anteverted, with anterior wall Caesarean section scar.

Two small probable uterine leiomyomata, intramural, 1.2 cm anterior
mid uterus and 9 mm intramural at posterior fundus.

Endometrial complex normal appearance 3 mm diameter.

No endometrial fluid or gestational sac.

RIGHT ovary normal size and morphology, 2.1 x 3.0 x 2.1 cm.

LEFT ovary normal size and morphology, 3.4 x 4.1 x 2.6 cm.

No adnexal masses or free pelvic fluid.
IMPRESSION: No intrauterine gestation identified.

Findings are consistent with pregnancy of unknown location.

Differential diagnosis includes early intrauterine pregnancy too
early to visualize, spontaneous abortion, and ectopic pregnancy.

Serial quantitative beta HCG and or follow-up ultrasound recommended
to definitively exclude ectopic pregnancy.
